# Patient Record
Sex: Female | Born: 1980 | Race: White | Hispanic: No | Marital: Married | Smoking: Never smoker
Health system: Southern US, Community
[De-identification: ages and names within clinical notes are randomized; demographics above are authoritative.]

## PROBLEM LIST (undated history)

## (undated) DIAGNOSIS — G43909 Migraine, unspecified, not intractable, without status migrainosus: Secondary | ICD-10-CM

## (undated) DIAGNOSIS — E079 Disorder of thyroid, unspecified: Secondary | ICD-10-CM

## (undated) DIAGNOSIS — E559 Vitamin D deficiency, unspecified: Secondary | ICD-10-CM

## (undated) DIAGNOSIS — L509 Urticaria, unspecified: Secondary | ICD-10-CM

## (undated) HISTORY — DX: Urticaria, unspecified: L50.9

## (undated) HISTORY — DX: Migraine, unspecified, not intractable, without status migrainosus: G43.909

## (undated) HISTORY — DX: Vitamin D deficiency, unspecified: E55.9

## (undated) HISTORY — DX: Disorder of thyroid, unspecified: E07.9

---

## 2013-11-02 ENCOUNTER — Ambulatory Visit (INDEPENDENT_AMBULATORY_CARE_PROVIDER_SITE_OTHER): Payer: BC Managed Care – PPO | Admitting: Internal Medicine

## 2013-11-02 ENCOUNTER — Encounter: Payer: Self-pay | Admitting: Internal Medicine

## 2013-11-02 VITALS — BP 108/62 | HR 85 | Temp 98.4°F | Resp 12 | Ht 67.5 in | Wt 291.0 lb

## 2013-11-02 DIAGNOSIS — E063 Autoimmune thyroiditis: Secondary | ICD-10-CM | POA: Insufficient documentation

## 2013-11-02 LAB — TSH: TSH: 0.94 u[IU]/mL (ref 0.35–5.50)

## 2013-11-02 LAB — T4, FREE: Free T4: 1.45 ng/dL (ref 0.60–1.60)

## 2013-11-02 MED ORDER — LEVOTHYROXINE SODIUM 112 MCG PO TABS
224.0000 ug | ORAL_TABLET | Freq: Every day | ORAL | Status: DC
Start: 2013-11-02 — End: 2014-02-12

## 2013-11-02 NOTE — Progress Notes (Addendum)
Patient ID: Courtney Miranda, female   DOB: 08/13/1980, 33 y.o.   MRN: [id number]   HPI  Courtney Miranda is a 33 y.o.-year-old female, self-referred for evaluation for hypothyroidism. Her mother accompanies her today and offers part of the history.  Pt. has been dx with hypothyroidism in 2000; is on Synthroid 224 mcg, taken: - fasting - with water  - separated by 2h from b'fast (at 4 am and she starts work at 5 am) - no calcium, iron, PPIs, multivitamins   I reviewed pt's thyroid tests: -04/2013: TSH high >> 250 mcg decreased to 224 mcg Synthroid DAW.  She feels the best at the [facility].   Pt denies feeling nodules in neck, hoarseness, dysphagia/odynophagia, SOB with lying down.   Pt describes: - + gets sick easy - + cold intolerance - recently - + weight loss: 56 lbs in 1.5-2 years >> but in last 2 mo: gained 15 back, not changing anything - + fatigue - + sleeps great at night  - + constipation - + dry skin - + hair thinning and falling - + nails thin, brittle - + anxiety/+ depression >> started in 03/2013 before Synthroid dose change >> got worse - + migraines - + regular menstrual cycles - + occasional rashes >> resolved in 7 days - + memory loss  She has + FH of thyroid disorders in: brother with hypothyroidism; also great great GM and one aunt. No FH of thyroid cancer.  No h/o radiation tx to head or neck.No recent use of iodine supplements.  She had a Kenalog inj for sinus infection 2 weeks ago.   I reviewed her chart and she also has a history of PCOS, but last endocrinologist told her that she testosterone level was normal >> unlikely PCOS. She has vitamin D deficiency 21 >> 27.   Walks 2-3x a week for exercise.   Meals: - Breakfast: chicken bisquit - Lunch: meat + 2-3 veggies - Dinner: meat + 2-3 veggies - Snacks: fruit or veggie 0-1/day  ROS: Constitutional: see HPI Eyes: no blurry vision, no xerophthalmia ENT: + sore throat, + nodules palpated in throat, no  dysphagia/odynophagia, + occasional hoarseness Cardiovascular: no CP/SOB/palpitations/leg swelling Respiratory: no cough/SOB Gastrointestinal: no N/V/D/+ C Musculoskeletal: + both muscle/joint aches Skin: + rash, easy bruising, itching, hair loss Neurological: no tremors/numbness/tingling/dizziness Psychiatric:+ both  depression/anxiety  Past Medical History  Diagnosis Date  . Thyroid disease    Past Surgical History  Procedure Laterality Date  . Cesarean section  2002   History   Social History  . Marital Status: Single    Spouse Name: N/A    Number of Children: 1, age 913   Occupational History  . Crew member [employer]   Social History Main Topics  . Smoking status: Never Smoker   . Smokeless tobacco: Not on file  . Alcohol Use: No  . Drug Use: No   Current Outpatient Rx  Name  Route  Sig  Dispense  Refill  . Biotin 5000 MCG CAPS   Oral   Take 1 capsule by mouth daily.         . cholecalciferol (VITAMIN D) 1000 UNITS tablet   Oral   Take 4,000 Units by mouth daily.         Marland Kitchen. levothyroxine (SYNTHROID) 112 MCG tablet   Oral   Take 2 tablets (224 mcg total) by mouth daily before breakfast.   60 tablet   2     Allergies  Allergen Reactions  .  Imitrex [Sumatriptan]   . Reglan [Metoclopramide]   . Relpax [Eletriptan]    Family History  Problem Relation Age of Onset  . Fibromyalgia Mother   . Hypertension Mother   . Hypertension Father   . Thyroid disease Brother   . Hyperlipidemia Maternal Grandmother   . Diabetes Paternal Grandfather   . Stroke Paternal Grandfather   . Hypertension Paternal Grandfather    PE: BP 108/62  Pulse 85  Temp(Src) 98.4 F (36.9 C) (Oral)  Resp 12  Ht 5' 7.5" (1.715 m)  Wt 291 lb (131.997 kg)  BMI 44.88 kg/m2  SpO2 97%  LMP 10/21/2013 Wt Readings from Last 3 Encounters:  11/02/13 291 lb (131.997 kg)   Constitutional: obese, in NAD Eyes: PERRLA, EOMI, no exophthalmos ENT: moist mucous membranes, no  thyromegaly, no cervical lymphadenopathy Cardiovascular: RRR, No MRG Respiratory: CTA B Gastrointestinal: abdomen soft, NT, ND, BS+ Musculoskeletal: no deformities, strength intact in all 4 Skin: moist, warm, + rashes - cat scratches on arms Neurological: no tremor with outstretched hands, DTR normal in all 4  ASSESSMENT: 1. Hashimoto's hypothyroidism  PLAN:  1. Patient with long-standing hypothyroidism, on levothyroxine therapy. She appears euthyroid, but has multiple complaints (see HPI) - We discussed about correct intake of levothyroxine, fasting, with water, separated by at least 30 minutes from breakfast, and separated by more than 4 hours from calcium, iron, multivitamins, acid reflux medications (PPIs). - She does not appear to have a goiter, thyroid nodules, or neck compression symptoms - we'll check thyroid tests today: TSH, free T4 - If these are abnormal, she will need to return in 6-8 weeks for repeat labs - If these are normal, I will see her back in 4 months - we discussed that she may need eval by an allergist since some of the sxs can be explained by food or other alergies - will obtain records from PCP with latest labs (thyroid, etc.)   Office Visit on 11/02/2013  Component Date Value Ref Range Status  . TSH 11/02/2013 0.94  0.35 - 5.50 uIU/mL Final  . Free T4 11/02/2013 1.45  0.60 - 1.60 ng/dL Final   TFTs normal. We can decrease the dose a little if she prefers as she likes being at the [facility].  Received labs from [facility]:  05/08/2013: - Total testosterone 18 (8-48), free testosterone <0.2 (0-2.2) - vitamin D 28.4 - Hemoglobin A1c 5.4% - TSH 0.12 (0.45-4.5), free T3 3.19 (2-4.4), free T4 1.88 (0.82-1.77) >> Synthroid dose was decreased to 2x 112 mcg daily. Plan for repeat labs in 6 weeks.  01/15/2013:  - Vitamin D 26.9  10/31/2012: - BMP normal, except glucose 101 - TSH 0.99, free T4-1 0.73 >> Synthroid dose maintained at 125 mcg x2  daily - total Testosterone 21, free testosterone 0.6

## 2013-11-02 NOTE — Patient Instructions (Signed)
Please stop at the lab. Please join MyChart >> I will send you the results through there. Please return in 4 months.

## 2013-11-05 ENCOUNTER — Encounter: Payer: Self-pay | Admitting: *Deleted

## 2014-02-12 ENCOUNTER — Other Ambulatory Visit: Payer: Self-pay | Admitting: *Deleted

## 2014-02-12 MED ORDER — LEVOTHYROXINE SODIUM 112 MCG PO TABS: 224.0000 ug | ORAL_TABLET | Freq: Every day | ORAL | Status: DC

## 2014-02-19 ENCOUNTER — Encounter: Payer: Self-pay | Admitting: Internal Medicine

## 2014-02-19 ENCOUNTER — Ambulatory Visit: Payer: BC Managed Care – PPO | Admitting: Internal Medicine

## 2014-02-19 ENCOUNTER — Ambulatory Visit (INDEPENDENT_AMBULATORY_CARE_PROVIDER_SITE_OTHER): Payer: BC Managed Care – PPO | Admitting: Internal Medicine

## 2014-02-19 VITALS — BP 110/76 | HR 84 | Temp 97.7°F | Resp 12 | Wt 297.0 lb

## 2014-02-19 DIAGNOSIS — E063 Autoimmune thyroiditis: Secondary | ICD-10-CM

## 2014-02-19 DIAGNOSIS — R110 Nausea: Secondary | ICD-10-CM

## 2014-02-19 NOTE — Progress Notes (Addendum)
Patient ID: Courtney Miranda, female   DOB: 05/06/1981, 33 y.o.   MRN: [id number]   HPI  Courtney Miranda is a 33 y.o.-year-old female, returning for f/u for Hashimoto's hypothyroidism, dx 2000. Last visit 3.5 mo ago.  She is on Synthroid DAW 224 mcg, taken: - fasting - with water  - separated by 2h from b'fast (at 4 am and she starts work at 5 am) - no calcium, iron, PPIs, multivitamins   I reviewed pt's thyroid tests: Lab Results  Component Value Date   TSH 0.94 11/02/2013   FREET4 1.45 11/02/2013  04/2013: TSH high >> 250 mcg decreased to 224 mcg Synthroid DAW.   She feels the best at the [facility].   Pt denies feeling nodules in neck, hoarseness, dysphagia/odynophagia, SOB with lying down.   Pt describes: - + more nausea in am lately - vomited once - + weight gain - no cold intolerance - no fatigue - + sleeps well at night  - no constipation - no dry skin - no hair thinning and falling - + anxiety/+ depression  - + migraines - + irregular menses lately   She also has a history of PCOS, but last endocrinologist told her that she testosterone level was normal >> unlikely PCOS. She has vitamin D deficiency 21 >> 27.   ROS: Constitutional: see HPI Eyes: no blurry vision, no xerophthalmia ENT: + sore throat, + nodules palpated in throat, no dysphagia/odynophagia, + occasional hoarseness Cardiovascular: no CP/SOB/palpitations/leg swelling Respiratory: no cough/SOB Gastrointestinal: no N/+ V/no D/C/heartburn Musculoskeletal: + both muscle/joint aches Skin: no rash, + easy bruising Neurological: no tremors/numbness/tingling/dizziness, more HAs recently  PE: BP 110/76  Pulse 84  Temp(Src) 97.7 F (36.5 C) (Oral)  Resp 12  Wt 297 lb (134.718 kg)  SpO2 95% Wt Readings from Last 3 Encounters:  02/19/14 297 lb (134.718 kg)  11/02/13 291 lb (131.997 kg)   Constitutional: obese, in NAD Eyes: PERRLA, EOMI, no exophthalmos ENT: moist mucous membranes, no thyromegaly, no cervical  lymphadenopathy Cardiovascular: RRR, No MRG Respiratory: CTA B Gastrointestinal: abdomen soft, NT, ND, BS+ Musculoskeletal: no deformities, strength intact in all 4 Skin: moist, warm, + rashes - cat scratches on arms Neurological: no tremor with outstretched hands, DTR normal in all 4  ASSESSMENT: 1. Hashimoto's hypothyroidism Received labs from [facility]:  05/08/2013: - Total testosterone 18 (8-48), free testosterone <0.2 (0-2.2) - vitamin D 28.4 - Hemoglobin A1c 5.4% - TSH 0.12 (0.45-4.5), free T3 3.19 (2-4.4), free T4 1.88 (0.82-1.77) >> Synthroid dose was decreased to 2x 112 mcg daily. Plan for repeat labs in 6 weeks.  01/15/2013:  - Vitamin D 26.9  10/31/2012: - BMP normal, except glucose 101 - TSH 0.99, free T4-1 0.73 >> Synthroid dose maintained at 125 mcg x2 daily - total Testosterone 21, free testosterone 0.6  2. Nausea  PLAN:  1. Patient with long-standing hypothyroidism, on levothyroxine therapy. She appears euthyroid, but continues to gain weight. She also has more nausea lately and also HAs. - We again discussed about correct intake of levothyroxine, fasting, with water, separated by at least 30 minutes from breakfast, and separated by more than 4 hours from calcium, iron, multivitamins, acid reflux medications (PPIs). She is taking it correctly. - She does not appear to have a goiter, thyroid nodules, or neck compression symptoms - we'll check thyroid tests today: TSH, free T4 - If these are abnormal, she will need to return in 6-8 weeks for repeat labs - If these are normal, I will see  her back in 12 months - She needs a refill of Synthroid DAW.  2. Nausea in am  - + irreg menses (although she does have PCOS) - will add a Pregnancy test  Office Visit on 02/19/2014  Component Date Value Ref Range Status  . TSH 02/19/2014 0.46  0.35 - 4.50 uIU/mL Final   The rest of the labs lost en route to the lab (?) >> will return to re-draw. Component      Latest Ref Rng 02/27/2014  Free T4     0.60 - 1.60 ng/dL 6.96  Quantitative HCG      0.23  Not pregnant.  Will continue the same dose of Synthroid 224 mcg daily,

## 2014-02-19 NOTE — Patient Instructions (Addendum)
Please stop at the lab. Please come back for a follow-up appointment in 12 months.

## 2014-02-20 LAB — TSH: TSH: 0.46 u[IU]/mL (ref 0.35–4.50)

## 2014-02-27 ENCOUNTER — Telehealth: Payer: Self-pay | Admitting: Internal Medicine

## 2014-02-27 ENCOUNTER — Other Ambulatory Visit (INDEPENDENT_AMBULATORY_CARE_PROVIDER_SITE_OTHER): Payer: BC Managed Care – PPO

## 2014-02-27 ENCOUNTER — Other Ambulatory Visit: Payer: Self-pay | Admitting: *Deleted

## 2014-02-27 DIAGNOSIS — R110 Nausea: Secondary | ICD-10-CM

## 2014-02-27 DIAGNOSIS — E063 Autoimmune thyroiditis: Secondary | ICD-10-CM

## 2014-02-27 NOTE — Telephone Encounter (Signed)
Called pt and advised her that the lab did not do all the test Dr Elvera LennoxGherghe ordered for pt. Pt to come back today to have labs repeated. Be advised.

## 2014-02-27 NOTE — Telephone Encounter (Signed)
Pt returning your call

## 2014-02-28 ENCOUNTER — Telehealth: Payer: Self-pay | Admitting: *Deleted

## 2014-02-28 LAB — T4, FREE: Free T4: 1.52 ng/dL (ref 0.60–1.60)

## 2014-02-28 LAB — HCG, QUANTITATIVE, PREGNANCY: QUANTITATIVE HCG: 0.23 m[IU]/mL

## 2014-02-28 MED ORDER — LEVOTHYROXINE SODIUM 112 MCG PO TABS: 224.0000 ug | ORAL_TABLET | Freq: Every day | ORAL | Status: DC

## 2014-02-28 NOTE — Addendum Note (Signed)
Addended by: Carlus PavlovGHERGHE, Karol Skarzynski on: 02/28/2014 07:50 AM   Modules accepted: Orders

## 2014-02-28 NOTE — Telephone Encounter (Signed)
Called and lvm advising her that her HCG test was negative. Thyroid test are normal. Stay on the same dosage of Synthroid, 224 mcg. Refill sent to her pharmacy.

## 2014-06-10 ENCOUNTER — Encounter: Payer: Self-pay | Admitting: Neurology

## 2014-06-10 ENCOUNTER — Ambulatory Visit (INDEPENDENT_AMBULATORY_CARE_PROVIDER_SITE_OTHER): Payer: BC Managed Care – PPO | Admitting: Neurology

## 2014-06-10 VITALS — BP 132/94 | HR 104 | Resp 16 | Ht 68.0 in | Wt 292.0 lb

## 2014-06-10 DIAGNOSIS — R519 Headache, unspecified: Secondary | ICD-10-CM

## 2014-06-10 DIAGNOSIS — R51 Headache: Secondary | ICD-10-CM

## 2014-06-10 MED ORDER — FROVATRIPTAN SUCCINATE 2.5 MG PO TABS: ORAL_TABLET | ORAL | Status: DC

## 2014-06-10 MED ORDER — NADOLOL 20 MG PO TABS
20.0000 mg | ORAL_TABLET | Freq: Every day | ORAL | Status: DC
Start: 2014-06-10 — End: 2015-05-16

## 2014-06-10 MED ORDER — SERTRALINE HCL 25 MG PO TABS: ORAL_TABLET | ORAL | Status: DC

## 2014-06-10 NOTE — Patient Instructions (Addendum)
1. Schedule MRI brain without contrast 2. Restart Sertraline 25mg : Take 1 tablet daily for 1 week, then increase to 2 tablets daily 3. After a month of starting Sertraline, restart Nadolol 20mg : Take 1 tablet daily 4. Start reducing Topamax by 1 tablet every week  5. Take Frova 2.5mg : Take 1 tablet at onset of headache, may take second dose after 2 hours. Do not take more than 3 a week to avoid rebound headaches 6. Keep headache diary 7. Follow-up in 3 months

## 2014-06-10 NOTE — Progress Notes (Signed)
NEUROLOGY Miranda NOTE  Courtney Miranda MRN: [id number] DOB: Mar 19, 1981  Referring provider: Dr. Blane Ohara Primary care provider: Dr. Blane Ohara  Reason for consult:  Worsening headaches  Dear Dr Sedalia Muta:  Thank you for your kind referral of Courtney Miranda of the above symptoms. Although her history is well known to you, please allow me to reiterate it for the purpose of our medical record. Records and images were personally reviewed where available.  HISTORY OF PRESENT ILLNESS: This is a pleasant 33 year old right-handed woman with a history of hypothyroidism and migraines since age 4, presenting with a change in headaches and dizziness. She was first diagnosed with migraines at age 28, initially occurring often, then became less frequent with a headache-free period of up to 2 years at a time. In July, she started having headaches which is "nothing like I had before, more severe and different." She woke up with nausea and vomiting, dizziness, and her head pounding. She was nauseated the whole weekend with headaches mostly over the right hemisphere and behind the right eye radiating down her neck. She described the pain as sharp stabbing pain on the right temporal region "like an explosion" lasting 5 minutes, leaving her head very sensitive to touch. She has been keeping a headache diary and had right-sided throbbing headaches daily with sharp pains once a week or so, but states last week was not so bad. Pain is usually a 3/10. She took Tylenol only twice last week. Ibuprofen makes the pain instantly worse. With more severe headaches, she has dizziness described as spinning, difficulties focusing, with photo/phonophobia and heightened sensitivity to smells. [facility] and started Topamax, currently on 75mg  qhs with minimal change. She has not been back for follow-up. She was given Baclofen to take prn, but this has not helped. She denies any side  effects on Topamax except for occasional paresthesias in her hands.  She denies any diplopia, tinnitus, dysarthria, dysphagia, focal numbness/tingling/weakness, neck/back pain, bowel/bladder dysfunction. She usually gets 7 hours of refreshing sleep and reports she "can sleep anytime." She denies any recent weight changes, she lost 45lbs 6 months ago.  Headache triggers include potatoes, white rice and pasta. She recalls trying Anaprox and Prozac at age 25, this made her drowsy. A few years ago, she was started on Sertraline which helped some with the headaches, but with addition of Nadolol, headaches significantly improved. She lost her job 2 years ago and stopped medication. She has tried Imitrex, Relpax, Reglan, which caused different side effects. No family history of migraines. She denies any head injuries.  PAST MEDICAL HISTORY: Past Medical History  Diagnosis Date  . Thyroid disease     PAST SURGICAL HISTORY: Past Surgical History  Procedure Laterality Date  . Cesarean section  2002    MEDICATIONS: Current Outpatient Prescriptions on File Prior to Visit  Medication Sig Dispense Refill  . levothyroxine (SYNTHROID) 112 MCG tablet Take 2 tablets (224 mcg total) by mouth daily before breakfast. 180 tablet 3   No current facility-administered medications on file prior to visit.    ALLERGIES: Allergies  Allergen Reactions  . Imitrex [Sumatriptan]   . Reglan [Metoclopramide]   . Relpax [Eletriptan]     FAMILY HISTORY: Family History  Problem Relation Age of Onset  . Fibromyalgia Mother   . Hypertension Mother   . Hypertension Father   . Thyroid disease Brother   . Hyperlipidemia Maternal Grandmother   . Diabetes Paternal  Grandfather   . Stroke Paternal Grandfather   . Hypertension Paternal Grandfather     SOCIAL HISTORY: History   Social History  . Marital Status: Single    Spouse Name: N/A    Number of Children: N/A  . Years of Education: N/A   Occupational  History  . Not on file.   Social History Main Topics  . Smoking status: Never Smoker   . Smokeless tobacco: Not on file  . Alcohol Use: No  . Drug Use: No  . Sexual Activity: Not on file   Other Topics Concern  . Not on file   Social History Narrative    REVIEW OF SYSTEMS: Constitutional: No fevers, chills, or sweats, no generalized fatigue, change in appetite Eyes: No visual changes, double vision, eye pain Ear, nose and throat: No hearing loss, ear pain, nasal congestion, sore throat Cardiovascular: No chest pain, palpitations Respiratory:  No shortness of breath at rest or with exertion, wheezes GastrointestinaI: No nausea, vomiting, diarrhea, abdominal pain, fecal incontinence Genitourinary:  No dysuria, urinary retention or frequency Musculoskeletal:  No neck pain, back pain Integumentary: No rash, pruritus, skin lesions Neurological: as above Psychiatric: No depression, insomnia, anxiety Endocrine: No palpitations, fatigue, diaphoresis, mood swings, change in appetite, change in weight, increased thirst Hematologic/Lymphatic:  No anemia, purpura, petechiae. Allergic/Immunologic: no itchy/runny eyes, nasal congestion, recent allergic reactions, rashes  PHYSICAL EXAM: Filed Vitals:   06/10/14 1403  BP: 132/94  Pulse: 104  Resp: 16   General: No acute distress Head:  Normocephalic/atraumatic Eyes: Fundoscopic exam shows bilateral sharp discs, no vessel changes, exudates, or hemorrhages Neck: supple, no paraspinal tenderness, full range of motion Back: No paraspinal tenderness Heart: regular rate and rhythm Lungs: Clear to auscultation bilaterally. Vascular: No carotid bruits. Skin/Extremities: No rash, no edema. Toes in left foot in bandage Neurological Exam: Mental status: alert and oriented to person, place, and time, no dysarthria or aphasia, Fund of knowledge is appropriate.  Recent and remote memory are intact.  Attention and concentration are normal.    Able  to name objects and repeat phrases. Cranial nerves: CN I: not tested CN II: pupils equal, round and reactive to light, visual fields intact, fundi unremarkable. CN III, IV, VI:  full range of motion, no nystagmus, no ptosis CN V: facial sensation intact CN VII: upper and lower face symmetric CN VIII: hearing intact to finger rub CN IX, X: gag intact, uvula midline CN XI: sternocleidomastoid and trapezius muscles intact CN XII: tongue midline Bulk & Tone: normal, no fasciculations. Motor: 5/5 throughout with no pronator drift. Sensation: intact to light touch, cold, pin, vibration and joint position sense.  No extinction to double simultaneous stimulation.  Romberg test negative Deep Tendon Reflexes: +2 throughout, no ankle clonus Plantar responses: downgoing bilaterally Cerebellar: no incoordination on finger to nose, heel to shin. No dysdiadochokinesia Gait: narrow-based and steady, able to tandem walk adequately. Tremor: none  IMPRESSION: This is a 33 year old right-handed woman with a history of hypothyroidism and migraines presenting with a change in headache quality and frequency for the past 4 months, mostly localized over the right hemisphere. Neurological exam is unremarkable. MRI brain without contrast will be ordered to assess for underlying structural abnormality. She has not had much response from Topamax, and although there is room for increase, we discussed that in the past she had good response to the combination of Sertraline and Nadolol, and will likely benefit from restarting this for headache prophylaxis. She will start sertraline first,  with uptitration schedule, then add-on low dose Nadolol. Side effects were discussed. She will start tapering off the Topamax off over the next 3 weeks. She has tried several triptans for rescue, and will do a trial of Frova to take at the onset of headaches. We discussed minimizing any rescue medication to 2-3 a week to avoid rebound  headaches. She will keep a headache diary and follow-up in 3 months.  Thank you for allowing me to participate in the care of this patient. Please do not hesitate to call for any questions or concerns.   Patrcia DollyKaren Shawanda Sievert, M.D.  CC: Dr. Sedalia Mutaox

## 2014-06-11 ENCOUNTER — Encounter: Payer: Self-pay | Admitting: Neurology

## 2014-06-11 NOTE — Progress Notes (Signed)
Done

## 2014-06-19 ENCOUNTER — Other Ambulatory Visit: Payer: Self-pay | Admitting: Neurology

## 2014-06-19 DIAGNOSIS — G43009 Migraine without aura, not intractable, without status migrainosus: Secondary | ICD-10-CM

## 2014-06-19 MED ORDER — ZOLMITRIPTAN 5 MG PO TABS: ORAL_TABLET | ORAL | Status: DC

## 2014-06-21 ENCOUNTER — Ambulatory Visit (HOSPITAL_COMMUNITY)
Admission: RE | Admit: 2014-06-21 | Discharge: 2014-06-21 | Disposition: A | Discharge status: Home / Self Care | Payer: BC Managed Care – PPO | Source: Ambulatory Visit | Attending: Neurology | Admitting: Neurology

## 2014-06-21 DIAGNOSIS — R51 Headache: Secondary | ICD-10-CM | POA: Diagnosis present

## 2014-06-25 ENCOUNTER — Telehealth: Payer: Self-pay | Admitting: Neurology

## 2014-06-25 NOTE — Telephone Encounter (Signed)
Returned call. Patient was notified of normal result.

## 2014-06-25 NOTE — Telephone Encounter (Signed)
Call after two please and call [phone] would like the MRI results

## 2014-09-09 ENCOUNTER — Ambulatory Visit (INDEPENDENT_AMBULATORY_CARE_PROVIDER_SITE_OTHER): Payer: BLUE CROSS/BLUE SHIELD | Admitting: Neurology

## 2014-09-09 ENCOUNTER — Encounter: Payer: Self-pay | Admitting: Neurology

## 2014-09-09 VITALS — BP 112/80 | HR 80 | Ht 67.5 in | Wt 290.0 lb

## 2014-09-09 DIAGNOSIS — G43009 Migraine without aura, not intractable, without status migrainosus: Secondary | ICD-10-CM

## 2014-09-09 NOTE — Patient Instructions (Signed)
1. Reduce Nadolol 20mg : Take 1/2 tablet daily 2. Continue Sertraline 50mg  daily 3. Keep headache diary 4. Follow-up in 3 months

## 2014-09-09 NOTE — Progress Notes (Signed)
NEUROLOGY FOLLOW UP OFFICE NOTE  Courtney Miranda [id number]  HISTORY OF PRESENT ILLNESS: I had the pleasure of seeing Courtney Miranda in follow-up in the neurology clinic on 09/09/2014.  The patient was last seen 3 months ago for a change in headaches and dizziness. Records and images were personally reviewed where available.  I personally reviewed MRI brain without contrast which was normal. Since her last visit, she has tapered off the Topamax. She had good response to combination of Sertraline and Nadolol several years ago, and restarted this combination with good effect. She brings a calendar of her headaches, she has had only 1 moderate to severe headache since February, 1 moderate, and 7 mild ones. She has noticed that prn Tylenol works better now. She was also reporting dizziness with the headaches, which still occurs but is much less, and sometimes separate from the headaches. She has had some nausea and vomiting, but no further vomiting this month. She describes the dizziness as a floating sensation, and has noticed her BP has been dropping to 97/53, usually she runs in the 100s/60s. She would like to reduce Nadolol dose. Mood is good. She denies any vision changes, no focal numbness/tingling/weakness, no tinnitus, no palpitations.   HPI: This is a pleasant 34 yo RH woman with a history of hypothyroidism and migraines since age 53, who presented with a change in headaches and dizziness. She was first diagnosed with migraines at age 38, initially occurring often, then became less frequent with a headache-free period of up to 2 years at a time. In July 2015, she started having headaches which is "nothing like I had before, more severe and different." She woke up with nausea and vomiting, dizziness, and her head pounding. She was nauseated the whole weekend with headaches mostly over the right hemisphere and behind the right eye radiating down her neck. She described the pain as sharp stabbing pain on the right  temporal region "like an explosion" lasting 5 minutes, leaving her head very sensitive to touch. She has been keeping a headache diary and had right-sided throbbing headaches daily with sharp pains once a week or so. Pain is usually a 3/10. Ibuprofen makes the pain instantly worse. With more severe headaches, she has dizziness described as spinning, difficulties focusing, with photo/phonophobia and heightened sensitivity to smells. [facility] 2015 and started Topamax, on  qhs with minimal change. She has not been back for follow-up. She was given Baclofen to take prn, but this has not helped. She denies any side effects on Topamax except for occasional paresthesias in her hands.  Headache triggers include potatoes, white rice and pasta. She recalls trying Anaprox and Prozac at age 24, this made her drowsy. A few years ago, she was started on Sertraline which helped some with the headaches, but with addition of Nadolol, headaches significantly improved. She lost her job 2 years ago and stopped medication. She has tried Imitrex, Relpax, Reglan, which caused different side effects. No family history of migraines. She denies any head injuries.  PAST MEDICAL HISTORY: Past Medical History  Diagnosis Date  . Thyroid disease     MEDICATIONS: Current Outpatient Prescriptions on File Prior to Visit  Medication Sig Dispense Refill  . levothyroxine (SYNTHROID) 112 MCG tablet Take 2 tablets (224 mcg total) by mouth daily before breakfast. 180 tablet 3  . nadolol (CORGARD) 20 MG tablet Take 1 tablet (20 mg total) by mouth daily. 30 tablet 5  . sertraline (ZOLOFT)  25 MG tablet Take 1 tablet daily for 1 week, then increase to 2 tablets daily 60 tablet 5   No current facility-administered medications on file prior to visit.    ALLERGIES: Allergies  Allergen Reactions  . Imitrex [Sumatriptan]   . Reglan [Metoclopramide]   . Relpax [Eletriptan]     FAMILY  HISTORY: Family History  Problem Relation Age of Onset  . Fibromyalgia Mother   . Hypertension Mother   . Hypertension Father   . Thyroid disease Brother   . Hyperlipidemia Maternal Grandmother   . Diabetes Paternal Grandfather   . Stroke Paternal Grandfather   . Hypertension Paternal Grandfather     SOCIAL HISTORY: History   Social History  . Marital Status: Married    Spouse Name: N/A  . Number of Children: N/A  . Years of Education: N/A   Occupational History  . Not on file.   Social History Main Topics  . Smoking status: Never Smoker   . Smokeless tobacco: Not on file  . Alcohol Use: No  . Drug Use: No  . Sexual Activity: Not on file   Other Topics Concern  . Not on file   Social History Narrative    REVIEW OF SYSTEMS: Constitutional: No fevers, chills, or sweats, no generalized fatigue, change in appetite Eyes: No visual changes, double vision, eye pain Ear, nose and throat: No hearing loss, ear pain, nasal congestion, sore throat Cardiovascular: No chest pain, palpitations Respiratory:  No shortness of breath at rest or with exertion, wheezes GastrointestinaI: No nausea, vomiting, diarrhea, abdominal pain, fecal incontinence Genitourinary:  No dysuria, urinary retention or frequency Musculoskeletal:  No neck pain, back pain Integumentary: No rash, pruritus, skin lesions Neurological: as above Psychiatric: No depression, insomnia, anxiety Endocrine: No palpitations, fatigue, diaphoresis, mood swings, change in appetite, change in weight, increased thirst Hematologic/Lymphatic:  No anemia, purpura, petechiae. Allergic/Immunologic: no itchy/runny eyes, nasal congestion, recent allergic reactions, rashes  PHYSICAL EXAM: Filed Vitals:   09/09/14 1519  BP: 112/80  Pulse: 80   General: No acute distress Head:  Normocephalic/atraumatic Neck: supple, no paraspinal tenderness, full range of motion Heart:  Regular rate and rhythm Lungs:  Clear to  auscultation bilaterally Back: No paraspinal tenderness Skin/Extremities: No rash, no edema Neurological Exam: alert and oriented to person, place, and time. No aphasia or dysarthria. Fund of knowledge is appropriate.  Recent and remote memory are intact.  Attention and concentration are normal.    Able to name objects and repeat phrases. Cranial nerves: Pupils equal, round, reactive to light.  Fundoscopic exam unremarkable, no papilledema. Extraocular movements intact with no nystagmus. Visual fields full. Facial sensation intact. No facial asymmetry. Tongue, uvula, palate midline.  Motor: Bulk and tone normal, muscle strength 5/5 throughout with no pronator drift.  Sensation to light touch intact.  No extinction to double simultaneous stimulation.  Deep tendon reflexes 2+ throughout, toes downgoing.  Finger to nose testing intact.  Gait narrow-based and steady, able to tandem walk adequately.  Romberg negative.  IMPRESSION: This is a 34 yo RH woman with a history of hypothyroidism and migraines who presented with a change in headache quality and frequency, mostly localized over the right hemisphere. Neurological exam and MRI brain normal. She has noticed an improvement in the headaches with restarting combination Sertraline and Nadolol, however has been more dizzy with low BP, and will reduce Nadolol dose to 1/2 tablet daily. She feels prn Tylenol now helps better with the headaches, and knows to minimize Tylenol to  2-3 times a week to avoid rebound headaches. She will continue to keep a headache diary and follow-up in 3 months.  Thank you for allowing me to participate in her care.  Please do not hesitate to call for any questions or concerns.  The duration of this appointment visit was 25 minutes of face-to-face time with the patient.  Greater than 50% of this time was spent in counseling, explanation of diagnosis, planning of further management, and coordination of care.   Courtney Miranda,  M.D.   CC: Dr. Sedalia Mutaox

## 2014-09-11 ENCOUNTER — Encounter: Payer: Self-pay | Admitting: Neurology

## 2014-09-11 DIAGNOSIS — G43009 Migraine without aura, not intractable, without status migrainosus: Secondary | ICD-10-CM | POA: Insufficient documentation

## 2014-09-11 NOTE — Progress Notes (Signed)
Done

## 2014-11-14 ENCOUNTER — Encounter: Payer: Self-pay | Admitting: Neurology

## 2014-12-25 ENCOUNTER — Ambulatory Visit: Payer: BLUE CROSS/BLUE SHIELD | Admitting: Neurology

## 2015-01-03 ENCOUNTER — Ambulatory Visit: Payer: BLUE CROSS/BLUE SHIELD | Admitting: Neurology

## 2015-02-03 ENCOUNTER — Other Ambulatory Visit: Payer: Self-pay | Admitting: Neurology

## 2015-02-05 ENCOUNTER — Ambulatory Visit (INDEPENDENT_AMBULATORY_CARE_PROVIDER_SITE_OTHER): Payer: BLUE CROSS/BLUE SHIELD | Admitting: Neurology

## 2015-02-05 ENCOUNTER — Encounter: Payer: Self-pay | Admitting: Neurology

## 2015-02-05 VITALS — BP 122/86 | HR 57 | Resp 16 | Ht 67.5 in | Wt 298.0 lb

## 2015-02-05 DIAGNOSIS — G43009 Migraine without aura, not intractable, without status migrainosus: Secondary | ICD-10-CM | POA: Diagnosis not present

## 2015-02-05 MED ORDER — RIZATRIPTAN BENZOATE 5 MG PO TBDP: 5.0000 mg | ORAL_TABLET | ORAL | Status: DC | PRN

## 2015-02-05 MED ORDER — TIZANIDINE HCL 4 MG PO TABS: 4.0000 mg | ORAL_TABLET | Freq: Three times a day (TID) | ORAL | Status: DC | PRN

## 2015-02-05 MED ORDER — SERTRALINE HCL 50 MG PO TABS
ORAL_TABLET | ORAL | Status: DC
Start: 2015-02-05 — End: 2015-05-16

## 2015-02-05 NOTE — Patient Instructions (Signed)
1. Increase Zoloft : Take 2 tablets daily 2. Take Tizanidine  as needed for neck pain, monitor for drowsiness, may take up to every 8 hours 3. Take Maxalt  as needed at onset of migraine, may take second dose after 2 hours. Do not take any rescue medication more than 3 times a week to avoid rebound headaches 4. Continue headache diary 5. Follow-up in 3 months

## 2015-02-05 NOTE — Progress Notes (Signed)
NEUROLOGY FOLLOW UP OFFICE NOTE  Courtney Miranda [id number]  HISTORY OF PRESENT ILLNESS: I had the pleasure of seeing Courtney Miranda in follow-up in the neurology clinic on 02/05/2015.  The patient was last seen 5 months ago for increased frequency of migraines. She had an initial good response to restarting Nadolol and Zoloft with reduction in headache frequency. She however noticed low BP and dizziness and requested reduction in Nadolol. She is now on 10mg  daily and denies any further dizziness except with severe migraines, however since March she has again had an increase in headaches. She brings her calendar, with mild to moderate headaches occurring daily, usually after 2pm. 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 She denies any headache triggers except for weather changes. She takes Tylenol once every 3 times she has a headache, but had to take it three times in one day last 7/5 for 10/10 migraine. She has had 4 10/10 migraines in the past week with associated nausea, right-sided neck pain, and dizziness. She denies any vision changes, no focal numbness/tingling/weakness, no tinnitus, no palpitations.   HPI: This is a pleasant 34 yo RH woman with a history of hypothyroidism and migraines since age 34, who presented with a change in headaches and dizziness. She was first diagnosed with migraines at age 34, initially occurring often, then became less frequent with a headache-free period of up to 2 years at a time. In July 2015, she started having headaches which is "nothing like I had before, more severe and different." She woke up with nausea and vomiting, dizziness, and her head pounding. She was nauseated the whole weekend with headaches mostly over the right hemisphere and behind the right eye radiating down her neck. She described the pain as sharp stabbing pain on the right temporal region "like an explosion" lasting 5 minutes, leaving her head very  sensitive to touch. She has been keeping a headache diary and had right-sided throbbing headaches daily with sharp pains once a week or so. Pain is usually a 3/10. Ibuprofen makes the pain instantly worse. With more severe headaches, she has dizziness described as spinning, difficulties focusing, with photo/phonophobia and heightened sensitivity to smells. She [facility] in September 2015 and started Topamax, on 75mg  qhs with minimal change. She has not been back for follow-up. She was given Baclofen to take prn, but this has not helped. She denies any side effects on Topamax except for occasional paresthesias in her hands.  Headache triggers include potatoes, white rice and pasta. She recalls trying Anaprox and Prozac at age 34, this made her drowsy. A few years ago, she was started on Sertraline which helped some with the headaches, but with addition of Nadolol, headaches significantly improved. She lost her job 2 years ago and stopped medication. She has tried Imitrex, Relpax, Reglan, which caused different side effects. No family history of migraines. She denies any head injuries.  Diagnostic Data: I personally reviewed MRI brain without contrast which was normal.   PAST MEDICAL HISTORY: Past Medical History  Diagnosis Date  . Thyroid disease     MEDICATIONS: Current Outpatient Prescriptions on File Prior to Visit  Medication Sig Dispense Refill  . levothyroxine (SYNTHROID) 112 MCG tablet Take 2 tablets (224 mcg total) by mouth daily before breakfast. 180 tablet 3  . nadolol (CORGARD) 20 MG tablet Take 1 tablet (20 mg total) by mouth daily. (Patient taking differently: 20 mg. Take 1/2  tablet daily) 30 tablet 5  . sertraline (ZOLOFT) 50 MG tablet Take 1 tablet daily 30 tablet 3   No current facility-administered medications on file prior to visit.    ALLERGIES: Allergies  Allergen Reactions  . Imitrex [Sumatriptan]   . Reglan [Metoclopramide]   . Relpax [Eletriptan]      FAMILY HISTORY: Family History  Problem Relation Age of Onset  . Fibromyalgia Mother   . Hypertension Mother   . Hypertension Father   . Thyroid disease Brother   . Hyperlipidemia Maternal Grandmother   . Diabetes Paternal Grandfather   . Stroke Paternal Grandfather   . Hypertension Paternal Grandfather     SOCIAL HISTORY: History   Social History  . Marital Status: Married    Spouse Name: N/A  . Number of Children: N/A  . Years of Education: N/A   Occupational History  . Not on file.   Social History Main Topics  . Smoking status: Never Smoker   . Smokeless tobacco: Not on file  . Alcohol Use: No  . Drug Use: No  . Sexual Activity: Not on file   Other Topics Concern  . Not on file   Social History Narrative    REVIEW OF SYSTEMS: Constitutional: No fevers, chills, or sweats, no generalized fatigue, change in appetite Eyes: No visual changes, double vision, eye pain Ear, nose and throat: No hearing loss, ear pain, nasal congestion, sore throat Cardiovascular: No chest pain, palpitations Respiratory:  No shortness of breath at rest or with exertion, wheezes GastrointestinaI: No nausea, vomiting, diarrhea, abdominal pain, fecal incontinence Genitourinary:  No dysuria, urinary retention or frequency Musculoskeletal:  + neck pain, back pain Integumentary: No rash, pruritus, skin lesions Neurological: as above Psychiatric: No depression, insomnia, anxiety Endocrine: No palpitations, fatigue, diaphoresis, mood swings, change in appetite, change in weight, increased thirst Hematologic/Lymphatic:  No anemia, purpura, petechiae. Allergic/Immunologic: no itchy/runny eyes, nasal congestion, recent allergic reactions, rashes  PHYSICAL EXAM: Filed Vitals:   02/05/15 1028  BP: 122/86  Pulse: 57  Resp: 16   General: No acute distress Head:  Normocephalic/atraumatic Neck: supple, no paraspinal tenderness, full range of motion Heart:  Regular rate and  rhythm Lungs:  Clear to auscultation bilaterally Back: No paraspinal tenderness Skin/Extremities: No rash, no edema Neurological Exam: alert and oriented to person, place, and time. No aphasia or dysarthria. Fund of knowledge is appropriate.  Recent and remote memory are intact.  Attention and concentration are normal.    Able to name objects and repeat phrases. Cranial nerves: Pupils equal, round, reactive to light.  Fundoscopic exam unremarkable, no papilledema. Extraocular movements intact with no nystagmus. Visual fields full. Facial sensation intact. No facial asymmetry. Tongue, uvula, palate midline.  Motor: Bulk and tone normal, muscle strength 5/5 throughout with no pronator drift.  Sensation to light touch intact.  No extinction to double simultaneous stimulation.  Deep tendon reflexes 2+ throughout, toes downgoing.  Finger to nose testing intact.  Gait narrow-based and steady, able to tandem walk adequately.  Romberg negative.  IMPRESSION: This is a 34 yo RH woman with a history of hypothyroidism and migraines who presented with a change in headache quality and frequency, mostly localized over the right hemisphere. Neurological exam and MRI brain normal. She had an initial good response to restarting combination Sertraline and Nadolol, however headaches have recurred, now back to occurring daily. She is hesitant to increase back the Nadolol due to low BP, and will increase Sertraline to  daily. She will take prn  Flexeril for neck pain, and will try Maxalt for migraine rescue. She has tried several triptans in the past with no effect. She knows to minimize Tylenol or any headache rescue medication to 2-3 times a week to avoid rebound headaches. She will continue to keep a headache diary and follow-up in 3 months.  Thank you for allowing me to participate in her care.  Please do not hesitate to call for any questions or concerns.  The duration of this appointment visit was 26 minutes of  face-to-face time with the patient.  Greater than 50% of this time was spent in counseling, explanation of diagnosis, planning of further management, and coordination of care.   Patrcia Dolly, M.D.

## 2015-03-10 ENCOUNTER — Other Ambulatory Visit: Payer: Self-pay | Admitting: Internal Medicine

## 2015-04-30 ENCOUNTER — Ambulatory Visit (INDEPENDENT_AMBULATORY_CARE_PROVIDER_SITE_OTHER): Payer: BLUE CROSS/BLUE SHIELD | Admitting: Internal Medicine

## 2015-04-30 ENCOUNTER — Encounter: Payer: Self-pay | Admitting: Internal Medicine

## 2015-04-30 VITALS — BP 112/68 | HR 73 | Temp 98.2°F | Resp 12 | Wt 301.4 lb

## 2015-04-30 DIAGNOSIS — E063 Autoimmune thyroiditis: Secondary | ICD-10-CM

## 2015-04-30 DIAGNOSIS — E559 Vitamin D deficiency, unspecified: Secondary | ICD-10-CM | POA: Diagnosis not present

## 2015-04-30 LAB — VITAMIN D 25 HYDROXY (VIT D DEFICIENCY, FRACTURES): VITD: 18.32 ng/mL — AB (ref 30.00–100.00)

## 2015-04-30 LAB — T4, FREE: Free T4: 1.3 ng/dL (ref 0.60–1.60)

## 2015-04-30 LAB — TSH: TSH: 1.7 u[IU]/mL (ref 0.35–4.50)

## 2015-04-30 NOTE — Patient Instructions (Signed)
Please continue Synthroid 224 mcg daily.  Take the thyroid hormone every day, with water, >30 minutes before breakfast, separated by >4 hours from acid reflux medications, calcium, iron, multivitamins.  Please stop at the lab.  Please return in 1 year.

## 2015-04-30 NOTE — Progress Notes (Signed)
Patient ID: Courtney Miranda, female   DOB: June 13, 1981, 34 y.o.   MRN: [id number]   HPI  Courtney Miranda is a 34 y.o.-year-old female, returning for f/u for Hashimoto's hypothyroidism, dx 2000. Last visit 1 year and 2 mo ago.  She had sinus inf and laryngitis this summer >> now better. She also had a rash on her legs. She had another sinusitis + bronchitis in September >> 3 ABx.   HAs still present.   She is on Synthroid DAW 224 mcg, taken: - fasting - with water  - separated by at 2h from b'fast (at 3 am and she starts work at 4 am) - no calcium, iron, PPIs - multivitamins at night  I reviewed pt's thyroid tests: Lab Results  Component Value Date   TSH 0.46 02/19/2014   TSH 0.94 11/02/2013   FREET4 1.52 02/27/2014   FREET4 1.45 11/02/2013  04/2013: TSH high >> 250 mcg decreased to 224 mcg Synthroid DAW. TFTs normal after the decrease in dose.  She feels the best at the [facility].   Pt denies feeling nodules in neck, hoarseness, dysphagia/odynophagia, SOB with lying down.   Pt describes: - + weight gain - + nausea better - + cold intolerance - + fatigue - no constipation - no dry skin - no hair thinning and falling - + migraines  She also has a history of PCOS. She has vitamin D deficiency.   Her Nadolol dose was decreased from 20 to 10 mg daily.   ROS: Constitutional: see HPI Eyes: no blurry vision, no xerophthalmia ENT: + sore throat, no nodules palpated in throat, no dysphagia/odynophagia, no hoarseness Cardiovascular: no CP/SOB/palpitations/leg swelling Respiratory: + cough/no SOB Gastrointestinal: + N/no V/D/C/heartburn Musculoskeletal: + both: muscle/joint aches Skin: + rash, + easy bruising Neurological: no tremors/numbness/tingling/dizziness, + HAs  PE: BP 112/68 mmHg  Pulse 73  Temp(Src) 98.2 F (36.8 C) (Oral)  Resp 12  Wt 301 lb 6.4 oz (136.714 kg)  SpO2 96% Body mass index is 46.48 kg/(m^2). Wt Readings from Last 3 Encounters:  04/30/15 301 lb 6.4 oz (136.714 kg)   02/05/15 298 lb (135.172 kg)  09/09/14 290 lb (131.543 kg)   Constitutional: obese, in NAD Eyes: PERRLA, EOMI, no exophthalmos ENT: moist mucous membranes, no thyromegaly, no cervical lymphadenopathy Cardiovascular: RRR, No MRG Respiratory: CTA B Gastrointestinal: abdomen soft, NT, ND, BS+ Musculoskeletal: no deformities, strength intact in all 4 Skin: moist, warm Neurological: no tremor with outstretched hands, DTR normal in all 4  ASSESSMENT: 1. Hashimoto's hypothyroidism  2. Vitamin D def  Labs from [facility]: 05/08/2013: - Total testosterone 18 (8-48), free testosterone <0.2 (0-2.2) - vitamin D 28.4 - Hemoglobin A1c 5.4% - TSH 0.12 (0.45-4.5), free T3 3.19 (2-4.4), free T4 1.88 (0.82-1.77) >> Synthroid dose was decreased to 2x 112 mcg daily. Plan for repeat labs in 6 weeks.  01/15/2013:  - Vitamin D 26.9  10/31/2012: - BMP normal, except glucose 101 - TSH 0.99, free T4-1 0.73 >> Synthroid dose maintained at 125 mcg x2 daily - total Testosterone 21, free testosterone 0.6  PLAN:  1. Patient with long-standing hypothyroidism, on Synthroid DAW 224 mcg.. She appears euthyroid, but has multiple complaints. - We again discussed about correct intake of levothyroxine, fasting, with water, separated by at least 30 minutes from breakfast, and separated by more than 4 hours from calcium, iron, multivitamins, acid reflux medications (PPIs). She is taking it correctly. - She does not appear to have a goiter, thyroid nodules, or neck compression symptoms -  we'll check thyroid tests today: TSH, free T4 - If these are abnormal, she will need to return in 6-8 weeks for repeat labs - If these are normal, I will see her back in 1 year  2. Vit D def - reviewed previous vit D levels - will recheck today - she is only getting vit D from her [facility], we may need to supplement this  Component     Latest Ref Rng 04/30/2015  TSH     0.35 - 4.50 uIU/mL 1.70  Free T4     0.60  - 1.60 ng/dL 4.091.30  VITD     81.1930.00 - 100.00 ng/mL 18.32 (L)   Thyroid tests are great. Vitamin D is low. I would suggest that she starts 5000 units of vitamin D daily and come back for a repeat vitamin D in 2 months. Alternatively, she can go to her PCPs office for recheck.

## 2015-05-12 ENCOUNTER — Telehealth: Payer: Self-pay | Admitting: Neurology

## 2015-05-12 NOTE — Telephone Encounter (Signed)
Returned call to P[place]kaa[place]ie. She wanted clarification on patient's Zoloft Rx. Patient is supposed to be taking two 50mg  tablets daily.

## 2015-05-12 NOTE — Telephone Encounter (Signed)
[facility]'s [facility] for [facility]/Dawn CB# [phone]

## 2015-05-16 ENCOUNTER — Encounter: Payer: Self-pay | Admitting: Neurology

## 2015-05-16 ENCOUNTER — Ambulatory Visit (INDEPENDENT_AMBULATORY_CARE_PROVIDER_SITE_OTHER): Payer: BLUE CROSS/BLUE SHIELD | Admitting: Neurology

## 2015-05-16 VITALS — BP 120/82 | HR 75 | Ht 67.5 in | Wt 303.0 lb

## 2015-05-16 DIAGNOSIS — G43009 Migraine without aura, not intractable, without status migrainosus: Secondary | ICD-10-CM | POA: Diagnosis not present

## 2015-05-16 MED ORDER — ZONISAMIDE 100 MG PO CAPS: ORAL_CAPSULE | ORAL | Status: DC

## 2015-05-16 NOTE — Patient Instructions (Signed)
1. Start Zonegran 100mg : take 1 capsule at night for 2 weeks, then increase to 2 capsules at night 2. After 2 weeks of taking Zonegran, stop Nadolol 3. Continue Zoloft 4. Do not take any over the counter medication more than 2-3 times a week to avoid rebound headaches 5. Continue headache calendar 6. Follow-up in 3 months, call for any problems

## 2015-05-16 NOTE — Progress Notes (Signed)
NEUROLOGY FOLLOW UP OFFICE NOTE  Dolly Luan Pulling [id number]  HISTORY OF PRESENT ILLNESS: I had the pleasure of seeing Nicholas Tackitt in follow-up in the neurology clinic on 05/16/2015.  The patient was last seen 3 months ago for increased frequency of migraines. She had an initial good response to restarting Nadolol and Zoloft with reduction in headache frequency. Headaches however recurred, dose of Zoloft increased to /day on last visit. She was doing good in July and August, but over the past month "went crazy." She has only had 6 headache-free days, with 11 moderate headaches and 3 severe ones in the past month. She has had associated nausea, lightheadedness. She takes Maxalt, sometimes with good effect. She reports having a sinus infection and bronchitis at the end of August, and was on 3 different antibiotics. She reports sleep is good, but has been told she snores loudly. She denies any vision changes, no focal numbness/tingling/weakness, no tinnitus, no palpitations.   HPI: This is a pleasant 34 yo RH woman with a history of hypothyroidism and migraines since age 77, who presented with a change in headaches and dizziness. She was first diagnosed with migraines at age 33, initially occurring often, then became less frequent with a headache-free period of up to 2 years at a time. In July 2015, she started having headaches which is "nothing like I had before, more severe and different." She woke up with nausea and vomiting, dizziness, and her head pounding. She was nauseated the whole weekend with headaches mostly over the right hemisphere and behind the right eye radiating down her neck. She described the pain as sharp stabbing pain on the right temporal region "like an explosion" lasting 5 minutes, leaving her head very sensitive to touch. She has been keeping a headache diary and had right-sided throbbing headaches daily with sharp pains once a week or so. Pain is usually a 3/10. Ibuprofen makes the  pain instantly worse. With more severe headaches, she has dizziness described as spinning, difficulties focusing, with photo/phonophobia and heightened sensitivity to smells. She [facility] 2015 and started Topamax, on  qhs with minimal change. She has not been back for follow-up. She was given Baclofen to take prn, but this has not helped. She denies any side effects on Topamax except for occasional paresthesias in her hands.  Headache triggers include potatoes, white rice and pasta. She recalls trying Anaprox and Prozac at age 13, this made her drowsy. A few years ago, she was started on Sertraline which helped some with the headaches, but with addition of Nadolol, headaches significantly improved. She lost her job 2 years ago and stopped medication. She has tried Imitrex, Relpax, Reglan, which caused different side effects. No family history of migraines. She denies any head injuries.  Diagnostic Data: I personally reviewed MRI brain without contrast which was normal.  Tried: Elavil, Topamax  PAST MEDICAL HISTORY: Past Medical History  Diagnosis Date  . Thyroid disease   . Vitamin D deficiency     MEDICATIONS: Current Outpatient Prescriptions on File Prior to Visit  Medication Sig Dispense Refill  . nadolol (CORGARD) 20 MG tablet Take 1 tablet (20 mg total) by mouth daily. (Patient taking differently: Take 10 mg by mouth daily. Take 1/2 tablet daily) 30 tablet 5  . rizatriptan (MAXALT-MLT) 5 MG disintegrating tablet Take 1 tablet (5 mg total) by mouth as needed for migraine. May repeat in 2 hours if needed 10 tablet 3  . SYNTHROID 112 MCG  tablet TAKE 2 TABLETS DAILY BEFORE BREAKFAST (224 MG) 180 tablet 0  . tiZANidine (ZANAFLEX) 4 MG tablet Take 1 tablet (4 mg total) by mouth every 8 (eight) hours as needed for muscle spasms. 30 tablet 2   No current facility-administered medications on file prior to visit.    ALLERGIES: Allergies  Allergen Reactions    . Imitrex [Sumatriptan]   . Reglan [Metoclopramide]   . Relpax [Eletriptan]     FAMILY HISTORY: Family History  Problem Relation Age of Onset  . Fibromyalgia Mother   . Hypertension Mother   . Hypertension Father   . Thyroid disease Brother   . Hyperlipidemia Maternal Grandmother   . Diabetes Paternal Grandfather   . Stroke Paternal Grandfather   . Hypertension Paternal Grandfather     SOCIAL HISTORY: Social History   Social History  . Marital Status: Married    Spouse Name: N/A  . Number of Children: N/A  . Years of Education: N/A   Occupational History  . Not on file.   Social History Main Topics  . Smoking status: Never Smoker   . Smokeless tobacco: Not on file  . Alcohol Use: No  . Drug Use: No  . Sexual Activity: Not on file   Other Topics Concern  . Not on file   Social History Narrative    REVIEW OF SYSTEMS: Constitutional: No fevers, chills, or sweats, no generalized fatigue, change in appetite Eyes: No visual changes, double vision, eye pain Ear, nose and throat: No hearing loss, ear pain, nasal congestion, sore throat Cardiovascular: No chest pain, palpitations Respiratory:  No shortness of breath at rest or with exertion, wheezes GastrointestinaI: No nausea, vomiting, diarrhea, abdominal pain, fecal incontinence Genitourinary:  No dysuria, urinary retention or frequency Musculoskeletal:  No neck pain, back pain Integumentary: No rash, pruritus, skin lesions Neurological: as above Psychiatric: No depression, insomnia, anxiety Endocrine: No palpitations, fatigue, diaphoresis, mood swings, change in appetite, change in weight, increased thirst Hematologic/Lymphatic:  No anemia, purpura, petechiae. Allergic/Immunologic: no itchy/runny eyes, nasal congestion, recent allergic reactions, rashes  PHYSICAL EXAM: Filed Vitals:   05/16/15 1541  BP: 120/82  Pulse: 75   General: No acute distress Head:  Normocephalic/atraumatic Neck: supple, no  paraspinal tenderness, full range of motion Heart:  Regular rate and rhythm Lungs:  Clear to auscultation bilaterally Back: No paraspinal tenderness Skin/Extremities: No rash, no edema Neurological Exam: alert and oriented to person, place, and time. No aphasia or dysarthria. Fund of knowledge is appropriate.  Recent and remote memory are intact.  Attention and concentration are normal.    Able to name objects and repeat phrases. Cranial nerves: Pupils equal, round, reactive to light.  Fundoscopic exam unremarkable, no papilledema. Extraocular movements intact with no nystagmus. Visual fields full. Facial sensation intact. No facial asymmetry. Tongue, uvula, palate midline.  Motor: Bulk and tone normal, muscle strength 5/5 throughout with no pronator drift.  Sensation to light touch intact.  No extinction to double simultaneous stimulation.  Deep tendon reflexes 2+ throughout, toes downgoing.  Finger to nose testing intact.  Gait narrow-based and steady, able to tandem walk adequately.  Romberg negative.  IMPRESSION: This is a 34 yo RH woman with a history of hypothyroidism and migraines who presented with a change in headache quality and frequency, mostly localized over the right hemisphere. Neurological exam and MRI brain normal. She had an initial good response to restarting combination Sertraline and Nadolol, however headaches have recurred, with side effects on higher dose Nadolol. She is  on Nadolol 10mg  daily. Continue Sertraline 100mg  daily. We discussed addition of a different headache preventative medication, and tapering off Nadolol. We discussed options, she will start Zonisamide 100mg  qhs x 2 weeks, then increase to 200mg  qhs. Side effects were discussed. After 2 weeks on Zonisamide, she will stop Nadolol. She knows to minimize any headache rescue medication to 2-3 times a week to avoid rebound headaches. Flexeril helps with neck pain, continue prn for muscle spasms/neck pain. She will continue  to keep a headache diary and follow-up in 3 months.  Thank you for allowing me to participate in her care.  Please do not hesitate to call for any questions or concerns.  The duration of this appointment visit was 25 minutes of face-to-face time with the patient.  Greater than 50% of this time was spent in counseling, explanation of diagnosis, planning of further management, and coordination of care.   Patrcia DollyKaren Aquino, M.D.   CC: Dr. Sedalia Mutaox

## 2015-06-20 ENCOUNTER — Other Ambulatory Visit: Payer: Self-pay | Admitting: Internal Medicine

## 2015-08-19 ENCOUNTER — Encounter: Payer: Self-pay | Admitting: Neurology

## 2015-08-19 ENCOUNTER — Ambulatory Visit (INDEPENDENT_AMBULATORY_CARE_PROVIDER_SITE_OTHER): Payer: BLUE CROSS/BLUE SHIELD | Admitting: Neurology

## 2015-08-19 VITALS — BP 134/82 | HR 98 | Ht 67.5 in | Wt 307.0 lb

## 2015-08-19 DIAGNOSIS — G43009 Migraine without aura, not intractable, without status migrainosus: Secondary | ICD-10-CM | POA: Diagnosis not present

## 2015-08-19 MED ORDER — RIZATRIPTAN BENZOATE 5 MG PO TBDP: 5.0000 mg | ORAL_TABLET | ORAL | Status: DC | PRN

## 2015-08-19 MED ORDER — ZONISAMIDE 100 MG PO CAPS: ORAL_CAPSULE | ORAL | Status: DC

## 2015-08-19 NOTE — Patient Instructions (Signed)
1. Once stress has leveled out, try increasing Zonegran again to  daily. If still not tolerating higher dose, call our office and we will plan to start gabapentin 2. Take Maxalt and Tylenol as needed at onset of migraine. Do not take more than 2 a week 3. Consider physical therapy for neck pain in the future 4. Keep headache diary, follow-up in 8 months, call for any problems

## 2015-08-19 NOTE — Progress Notes (Signed)
NEUROLOGY FOLLOW UP OFFICE NOTE  Ireanna Luan Pulling [id number]  HISTORY OF PRESENT ILLNESS: I had the pleasure of seeing Calie Ureste in follow-up in the neurology clinic on [id number].  The patient was last seen 3 months ago for increased frequency of migraines. She had an initial good response to restarting Nadolol and Zoloft with reduction in headache frequency. Headaches however recurred, dose of Zoloft increased to /day on last visit. She initially did well, but headaches again recurred. She had side effects on higher dose Nadolol. She was started on Zonegran and tapered off Nadolol. She was shaking really bad with  dose of Zonegran, and reduced to  qhs which she is tolerating better. She reports that she now has panic attacks with the more severe migraines, and feels that a lot of her symptoms are related to stress. She has moved down in her job and does feel less stress. She has 3-6 migraines a week, taking Maxalt with Tylenol 1-2 times a week. She has also noticed that there is a sore spot on the left side of her neck/shoulder, she has neck tightness. She has dizziness with the headaches. She has nausea, at times occurring independently of the migraines. She denies any focal numbness/tingling/weakness, no vision changes, no falls.   HPI: This is a pleasant 35 yo RH woman with a history of hypothyroidism and migraines since age 60, who presented with a change in headaches and dizziness. She was first diagnosed with migraines at age 33, initially occurring often, then became less frequent with a headache-free period of up to 2 years at a time. In July 2015, she started having headaches which is "nothing like I had before, more severe and different." She woke up with nausea and vomiting, dizziness, and her head pounding. She was nauseated the whole weekend with headaches mostly over the right hemisphere and behind the right eye radiating down her neck. She described the pain as sharp stabbing pain  on the right temporal region "like an explosion" lasting 5 minutes, leaving her head very sensitive to touch. She has been keeping a headache diary and had right-sided throbbing headaches daily with sharp pains once a week or so. Pain is usually a 3/10. Ibuprofen makes the pain instantly worse. With more severe headaches, she has dizziness described as spinning, difficulties focusing, with photo/phonophobia and heightened sensitivity to smells. She [facility] in September 2015 and started Topamax, on  qhs with minimal change. She has not been back for follow-up. She was given Baclofen to take prn, but this has not helped. She denies any side effects on Topamax except for occasional paresthesias in her hands.  Headache triggers include potatoes, white rice and pasta. She recalls trying Anaprox and Prozac at age 17, this made her drowsy. A few years ago, she was started on Sertraline which helped some with the headaches, but with addition of Nadolol, headaches significantly improved. She lost her job 2 years ago and stopped medication. She has tried Imitrex, Relpax, Reglan, which caused different side effects. No family history of migraines. She denies any head injuries.  Diagnostic Data: I personally reviewed MRI brain without contrast which was normal.  Tried: Elavil, Topamax  PAST MEDICAL HISTORY: Past Medical History  Diagnosis Date  . Thyroid disease   . Vitamin D deficiency     MEDICATIONS: Current Outpatient Prescriptions on File Prior to Visit  Medication Sig Dispense Refill  . b complex vitamins tablet Take 1 tablet by mouth daily.    Marland Kitchen  ergocalciferol (VITAMIN D2) 50000 UNITS capsule Take 50,000 Units by mouth once a week.    . Multiple Vitamin tablet Take 1 tablet by mouth daily.    . rizatriptan (MAXALT-MLT) 5 MG disintegrating tablet Take 1 tablet (5 mg total) by mouth as needed for migraine. May repeat in 2 hours if needed 10 tablet 3  . sertraline (ZOLOFT) 100 MG  tablet 100 mg. Take 1 tablet daily    . SYNTHROID 112 MCG tablet TAKE 2 TABLETS BY MOUTH DAILY BEFORE BREAKFAST (224 MCG TOTAL) 180 tablet 0  . tiZANidine (ZANAFLEX) 4 MG tablet Take 1 tablet (4 mg total) by mouth every 8 (eight) hours as needed for muscle spasms. 30 tablet 2  . zonisamide (ZONEGRAN) 100 MG capsule Take 1 capsule at night for 2 weeks, then increase to 2 capsules at night (Patient taking differently: Take 1 capsule every night) 60 capsule 4   No current facility-administered medications on file prior to visit.    ALLERGIES: Allergies  Allergen Reactions  . Imitrex [Sumatriptan]   . Reglan [Metoclopramide]   . Relpax [Eletriptan]     FAMILY HISTORY: Family History  Problem Relation Age of Onset  . Fibromyalgia Mother   . Hypertension Mother   . Hypertension Father   . Thyroid disease Brother   . Hyperlipidemia Maternal Grandmother   . Diabetes Paternal Grandfather   . Stroke Paternal Grandfather   . Hypertension Paternal Grandfather     SOCIAL HISTORY: Social History   Social History  . Marital Status: Married    Spouse Name: N/A  . Number of Children: N/A  . Years of Education: N/A   Occupational History  . Not on file.   Social History Main Topics  . Smoking status: Never Smoker   . Smokeless tobacco: Not on file  . Alcohol Use: No  . Drug Use: No  . Sexual Activity: Not on file   Other Topics Concern  . Not on file   Social History Narrative    REVIEW OF SYSTEMS: Constitutional: No fevers, chills, or sweats, no generalized fatigue, change in appetite Eyes: No visual changes, double vision, eye pain Ear, nose and throat: No hearing loss, ear pain, nasal congestion, sore throat Cardiovascular: No chest pain, palpitations Respiratory:  No shortness of breath at rest or with exertion, wheezes GastrointestinaI: No nausea, vomiting, diarrhea, abdominal pain, fecal incontinence Genitourinary:  No dysuria, urinary retention or  frequency Musculoskeletal:  No neck pain, back pain Integumentary: No rash, pruritus, skin lesions Neurological: as above Psychiatric: No depression, insomnia,+ anxiety Endocrine: No palpitations, fatigue, diaphoresis, mood swings, change in appetite, change in weight, increased thirst Hematologic/Lymphatic:  No anemia, purpura, petechiae. Allergic/Immunologic: no itchy/runny eyes, nasal congestion, recent allergic reactions, rashes  PHYSICAL EXAM: Filed Vitals:   08/19/15 1533  BP: 134/82  Pulse: 98   General: No acute distress Head:  Normocephalic/atraumatic Neck: supple, no paraspinal tenderness, full range of motion Heart:  Regular rate and rhythm Lungs:  Clear to auscultation bilaterally Back: No paraspinal tenderness Skin/Extremities: No rash, no edema Neurological Exam: alert and oriented to person, place, and time. No aphasia or dysarthria. Fund of knowledge is appropriate.  Recent and remote memory are intact.  Attention and concentration are normal.    Able to name objects and repeat phrases. Cranial nerves: Pupils equal, round, reactive to light.  Fundoscopic exam unremarkable, no papilledema. Extraocular movements intact with no nystagmus. Visual fields full. Facial sensation intact. No facial asymmetry. Tongue, uvula, palate midline.  Motor: Bulk  and tone normal, muscle strength 5/5 throughout with no pronator drift.  Sensation to light touch intact.  No extinction to double simultaneous stimulation.  Deep tendon reflexes 2+ throughout, toes downgoing.  Finger to nose testing intact.  Gait narrow-based and steady, able to tandem walk adequately.  Romberg negative.  IMPRESSION: This is a 35 yo RH woman with a history of hypothyroidism and migraines who presented with a change in headache quality and frequency, mostly localized over the right hemisphere. Neurological exam and MRI brain normal. She had an initial good response to restarting combination Sertraline and Nadolol,  however headaches have recurred, with side effects on higher dose Nadolol. She has tapered off Nadolol and currently takes Sertraline  daily and Zonegran  qhs. She has noticed that stress is playing a factor with her migraines and has changed her job. Once stress has leveled out, she would like to try increasing dose of Zonegran again to  qhs. If still having side effects, she will be switched to gabapentin. Botox may be considered in the future. She knows to minimize any headache rescue medication to 2-3 times a week to avoid rebound headaches. Flexeril helps with neck pain, continue prn for muscle spasms/neck pain. She would like to hold off on neck PT for now. She will continue to keep a headache diary and follow-up in 8 months.  Thank you for allowing me to participate in her care.  Please do not hesitate to call for any questions or concerns.  The duration of this appointment visit was 25 minutes of face-to-face time with the patient.  Greater than 50% of this time was spent in counseling, explanation of diagnosis, planning of further management, and coordination of care.   Patrcia Dolly, M.D.   CC: Dr. Sedalia Muta

## 2015-12-05 ENCOUNTER — Other Ambulatory Visit: Payer: Self-pay | Admitting: Internal Medicine

## 2015-12-16 DIAGNOSIS — G43109 Migraine with aura, not intractable, without status migrainosus: Secondary | ICD-10-CM | POA: Diagnosis not present

## 2015-12-19 DIAGNOSIS — R51 Headache: Secondary | ICD-10-CM | POA: Diagnosis not present

## 2015-12-19 DIAGNOSIS — R002 Palpitations: Secondary | ICD-10-CM | POA: Diagnosis not present

## 2015-12-19 DIAGNOSIS — M62838 Other muscle spasm: Secondary | ICD-10-CM | POA: Diagnosis not present

## 2015-12-19 DIAGNOSIS — R030 Elevated blood-pressure reading, without diagnosis of hypertension: Secondary | ICD-10-CM | POA: Diagnosis not present

## 2015-12-25 DIAGNOSIS — E039 Hypothyroidism, unspecified: Secondary | ICD-10-CM | POA: Diagnosis not present

## 2016-02-12 DIAGNOSIS — E039 Hypothyroidism, unspecified: Secondary | ICD-10-CM | POA: Diagnosis not present

## 2016-04-19 ENCOUNTER — Ambulatory Visit: Payer: BLUE CROSS/BLUE SHIELD | Admitting: Neurology

## 2016-05-27 DIAGNOSIS — E039 Hypothyroidism, unspecified: Secondary | ICD-10-CM | POA: Diagnosis not present

## 2016-09-28 DIAGNOSIS — E039 Hypothyroidism, unspecified: Secondary | ICD-10-CM | POA: Diagnosis not present

## 2017-01-10 DIAGNOSIS — N3000 Acute cystitis without hematuria: Secondary | ICD-10-CM | POA: Diagnosis not present

## 2017-02-05 DIAGNOSIS — J069 Acute upper respiratory infection, unspecified: Secondary | ICD-10-CM | POA: Diagnosis not present

## 2017-02-05 DIAGNOSIS — B9789 Other viral agents as the cause of diseases classified elsewhere: Secondary | ICD-10-CM | POA: Diagnosis not present

## 2017-02-05 DIAGNOSIS — J029 Acute pharyngitis, unspecified: Secondary | ICD-10-CM | POA: Diagnosis not present

## 2017-03-08 DIAGNOSIS — E039 Hypothyroidism, unspecified: Secondary | ICD-10-CM | POA: Diagnosis not present

## 2017-03-08 DIAGNOSIS — Z1329 Encounter for screening for other suspected endocrine disorder: Secondary | ICD-10-CM | POA: Diagnosis not present

## 2017-06-14 DIAGNOSIS — M549 Dorsalgia, unspecified: Secondary | ICD-10-CM | POA: Diagnosis not present

## 2017-06-14 DIAGNOSIS — R7302 Impaired glucose tolerance (oral): Secondary | ICD-10-CM | POA: Diagnosis not present

## 2017-06-14 DIAGNOSIS — E039 Hypothyroidism, unspecified: Secondary | ICD-10-CM | POA: Diagnosis not present

## 2017-07-21 DIAGNOSIS — J00 Acute nasopharyngitis [common cold]: Secondary | ICD-10-CM | POA: Diagnosis not present

## 2017-08-16 DIAGNOSIS — E039 Hypothyroidism, unspecified: Secondary | ICD-10-CM | POA: Diagnosis not present

## 2018-02-07 DIAGNOSIS — E039 Hypothyroidism, unspecified: Secondary | ICD-10-CM | POA: Diagnosis not present

## 2018-02-14 DIAGNOSIS — E039 Hypothyroidism, unspecified: Secondary | ICD-10-CM | POA: Diagnosis not present

## 2018-04-18 DIAGNOSIS — E039 Hypothyroidism, unspecified: Secondary | ICD-10-CM | POA: Diagnosis not present

## 2018-05-31 DIAGNOSIS — J029 Acute pharyngitis, unspecified: Secondary | ICD-10-CM | POA: Diagnosis not present

## 2018-08-14 DIAGNOSIS — E039 Hypothyroidism, unspecified: Secondary | ICD-10-CM | POA: Diagnosis not present

## 2019-02-06 DIAGNOSIS — E039 Hypothyroidism, unspecified: Secondary | ICD-10-CM | POA: Diagnosis not present

## 2019-02-13 DIAGNOSIS — E039 Hypothyroidism, unspecified: Secondary | ICD-10-CM | POA: Diagnosis not present

## 2019-02-13 DIAGNOSIS — R000 Tachycardia, unspecified: Secondary | ICD-10-CM | POA: Diagnosis not present

## 2019-03-11 DIAGNOSIS — R062 Wheezing: Secondary | ICD-10-CM | POA: Diagnosis not present

## 2019-03-11 DIAGNOSIS — Z20828 Contact with and (suspected) exposure to other viral communicable diseases: Secondary | ICD-10-CM | POA: Diagnosis not present

## 2019-10-23 ENCOUNTER — Encounter: Payer: Self-pay | Admitting: Nurse Practitioner

## 2019-10-23 ENCOUNTER — Other Ambulatory Visit: Payer: Self-pay

## 2019-10-23 ENCOUNTER — Other Ambulatory Visit: Payer: Self-pay | Admitting: Nurse Practitioner

## 2019-10-23 ENCOUNTER — Ambulatory Visit: Payer: BC Managed Care – PPO | Admitting: Nurse Practitioner

## 2019-10-23 VITALS — BP 142/82 | HR 97 | Temp 97.7°F | Ht 67.5 in | Wt 329.0 lb

## 2019-10-23 DIAGNOSIS — R1030 Lower abdominal pain, unspecified: Secondary | ICD-10-CM | POA: Diagnosis not present

## 2019-10-23 DIAGNOSIS — N3000 Acute cystitis without hematuria: Secondary | ICD-10-CM | POA: Diagnosis not present

## 2019-10-23 DIAGNOSIS — N898 Other specified noninflammatory disorders of vagina: Secondary | ICD-10-CM | POA: Diagnosis not present

## 2019-10-23 LAB — POCT URINALYSIS DIPSTICK
Bilirubin, UA: NEGATIVE
Blood, UA: NEGATIVE
Glucose, UA: NEGATIVE
Ketones, UA: NEGATIVE
Leukocytes, UA: NEGATIVE
Nitrite, UA: NEGATIVE
Protein, UA: NEGATIVE
Spec Grav, UA: 1.025 (ref 1.010–1.025)
Urobilinogen, UA: NEGATIVE E.U./dL — AB
pH, UA: 6 (ref 5.0–8.0)

## 2019-10-23 MED ORDER — NITROFURANTOIN MONOHYD MACRO 100 MG PO CAPS: 100.0000 mg | ORAL_CAPSULE | Freq: Two times a day (BID) | ORAL | 0 refills | Status: DC

## 2019-10-23 NOTE — Patient Instructions (Signed)
Urinary Tract Infection, Adult A urinary tract infection (UTI) is an infection of any part of the urinary tract. The urinary tract includes:  The kidneys.  The ureters.  The bladder.  The urethra. These organs make, store, and get rid of pee (urine) in the body. What are the causes? This is caused by germs (bacteria) in your genital area. These germs grow and cause swelling (inflammation) of your urinary tract. What increases the risk? You are more likely to develop this condition if:  You have a small, thin tube (catheter) to drain pee.  You cannot control when you pee or poop (incontinence).  You are female, and: ? You use these methods to prevent pregnancy:  A medicine that kills sperm (spermicide).  A device that blocks sperm (diaphragm). ? You have low levels of a female hormone (estrogen). ? You are pregnant.  You have genes that add to your risk.  You are sexually active.  You take antibiotic medicines.  You have trouble peeing because of: ? A prostate that is bigger than normal, if you are female. ? A blockage in the part of your body that drains pee from the bladder (urethra). ? A kidney stone. ? A nerve condition that affects your bladder (neurogenic bladder). ? Not getting enough to drink. ? Not peeing often enough.  You have other conditions, such as: ? Diabetes. ? A weak disease-fighting system (immune system). ? Sickle cell disease. ? Gout. ? Injury of the spine. What are the signs or symptoms? Symptoms of this condition include:  Needing to pee right away (urgently).  Peeing often.  Peeing small amounts often.  Pain or burning when peeing.  Blood in the pee.  Pee that smells bad or not like normal.  Trouble peeing.  Pee that is cloudy.  Fluid coming from the vagina, if you are female.  Pain in the belly or lower back. Other symptoms include:  Throwing up (vomiting).  No urge to eat.  Feeling mixed up (confused).  Being tired  and grouchy (irritable).  A fever.  Watery poop (diarrhea). How is this treated? This condition may be treated with:  Antibiotic medicine.  Other medicines.  Drinking enough water. Follow these instructions at home:  Medicines  Take over-the-counter and prescription medicines only as told by your doctor.  If you were prescribed an antibiotic medicine, take it as told by your doctor. Do not stop taking it even if you start to feel better. General instructions  Make sure you: ? Pee until your bladder is empty. ? Do not hold pee for a long time. ? Empty your bladder after sex. ? Wipe from front to back after pooping if you are a female. Use each tissue one time when you wipe.  Drink enough fluid to keep your pee pale yellow.  Keep all follow-up visits as told by your doctor. This is important. Contact a doctor if:  You do not get better after 1-2 days.  Your symptoms go away and then come back. Get help right away if:  You have very bad back pain.  You have very bad pain in your lower belly.  You have a fever.  You are sick to your stomach (nauseous).  You are throwing up. Summary  A urinary tract infection (UTI) is an infection of any part of the urinary tract.  This condition is caused by germs in your genital area.  There are many risk factors for a UTI. These include having a small, thin   tube to drain pee and not being able to control when you pee or poop.  Treatment includes antibiotic medicines for germs.  Drink enough fluid to keep your pee pale yellow. This information is not intended to replace advice given to you by your health care provider. Make sure you discuss any questions you have with your health care provider. Document Revised: 06/22/2018 Document Reviewed: 01/12/2018 [employer] Patient Education  2020 [employer]  

## 2019-10-23 NOTE — Progress Notes (Deleted)
Acute Office Visit  Subjective:    Patient ID: Courtney Miranda, female    DOB: May 02, 1981, 39 y.o.   MRN: [id number]  Chief Complaint  Patient presents with  . Urinary Tract Infection    Lower abdominal pain/ nausea   Patient  is a 39 year old female.  She is here for  urinary tract infection. The patient's medications were reviewed and reconciled since the patient's last visit.  History details were provided by the patient. The history appears to be reliable.    Urinary Tract Infection  This is a new problem. The current episode started in the past 7 days. The problem occurs every urination. The problem has been unchanged. There has been no fever. She is sexually active. There is no history of pyelonephritis. Associated symptoms include nausea and urgency. Pertinent negatives include no flank pain or hematuria. Associated symptoms comments: Foul odor urine. She has tried nothing for the symptoms. There is no history of catheterization or recurrent UTIs.    Past Medical History:  Diagnosis Date  . Thyroid disease   . Vitamin D deficiency     Past Surgical History:  Procedure Laterality Date  . CESAREAN SECTION  2002    Family History  Problem Relation Age of Onset  . Fibromyalgia Mother   . Hypertension Mother   . Hypertension Father   . Thyroid disease Brother   . Hyperlipidemia Maternal Grandmother   . Diabetes Paternal Grandfather   . Stroke Paternal Grandfather   . Hypertension Paternal Grandfather     Social History   Socioeconomic History  . Marital status: Married    Spouse name: Not on file  . Number of children: 1  . Years of education: Not on file  . Highest education level: Not on file  Occupational History  . Not on file  Tobacco Use  . Smoking status: Never Smoker  . Smokeless tobacco: Never Used  Substance and Sexual Activity  . Alcohol use: No    Alcohol/week: 0.0 standard drinks  . Drug use: No  . Sexual activity: Not on file  Other Topics Concern    . Not on file  Social History Narrative  . Not on file   Social Determinants of Health   Financial Resource Strain:   . Difficulty of Paying Living Expenses:   Food Insecurity:   . Worried About Charity fundraiser in the Last Year:   . Arboriculturist in the Last Year:   Transportation Needs:   . Film/video editor (Medical):   Marland Kitchen Lack of Transportation (Non-Medical):   Physical Activity:   . Days of Exercise per Week:   . Minutes of Exercise per Session:   Stress:   . Feeling of Stress :   Social Connections:   . Frequency of Communication with Friends and Family:   . Frequency of Social Gatherings with Friends and Family:   . Attends Religious Services:   . Active Member of Clubs or Organizations:   . Attends Archivist Meetings:   Marland Kitchen Marital Status:   Intimate Partner Violence:   . Fear of Current or Ex-Partner:   . Emotionally Abused:   Marland Kitchen Physically Abused:   . Sexually Abused:     Outpatient Medications Prior to Visit  Medication Sig Dispense Refill  . Multiple Vitamin tablet Take 1 tablet by mouth daily.    Marland Kitchen SYNTHROID 112 MCG tablet TAKE 2 TABLETS BY MOUTH DAILY BEFORE BREAKFAST. 180 tablet 0  .  b complex vitamins tablet Take 1 tablet by mouth daily.    . ergocalciferol (VITAMIN D2) 50000 UNITS capsule Take 50,000 Units by mouth once a week.    . rizatriptan (MAXALT-MLT) 5 MG disintegrating tablet Take 1 tablet (5 mg total) by mouth as needed for migraine. May repeat in 2 hours if needed 10 tablet 11  . sertraline (ZOLOFT) 100 MG tablet 100 mg. Take 1 tablet daily    . tiZANidine (ZANAFLEX) 4 MG tablet Take 1 tablet (4 mg total) by mouth every 8 (eight) hours as needed for muscle spasms. 30 tablet 2  . zonisamide (ZONEGRAN) 100 MG capsule Take 2 capsules at night 60 capsule 11   No facility-administered medications prior to visit.    Allergies  Allergen Reactions  . Eletriptan Other (See Comments)  . Metoclopramide Other (See Comments)  .  Sumatriptan Other (See Comments)    Review of Systems  Constitutional: Negative for activity change, appetite change and fatigue.  Respiratory: Negative for chest tightness and shortness of breath.   Cardiovascular: Negative for chest pain and palpitations.  Gastrointestinal: Positive for abdominal pain and nausea.       Lower abdominal pain  Genitourinary: Positive for urgency and vaginal discharge. Negative for difficulty urinating, flank pain, hematuria, pelvic pain, vaginal bleeding and vaginal pain.  Musculoskeletal: Negative for myalgias.  Skin: Negative for rash.  Neurological: Negative for headaches.  Psychiatric/Behavioral: Negative for behavioral problems.       Objective:    Physical Exam Constitutional:      Appearance: Normal appearance.  HENT:     Head: Normocephalic.     Nose: No congestion.  Eyes:     Conjunctiva/sclera: Conjunctivae normal.  Cardiovascular:     Rate and Rhythm: Normal rate and regular rhythm.     Pulses: Normal pulses.     Heart sounds: Normal heart sounds.  Pulmonary:     Effort: Pulmonary effort is normal.     Breath sounds: Normal breath sounds.  Abdominal:     General: Bowel sounds are normal.     Tenderness: There is abdominal tenderness.     Comments: Lower abdominal pain  Musculoskeletal:        General: No tenderness.     Cervical back: Neck supple.  Skin:    General: Skin is warm.     Findings: No erythema.  Neurological:     Motor: No weakness.     BP (!) 142/82 (BP Location: Left Arm, Patient Position: Sitting)   Pulse 97   Temp 97.7 F (36.5 C) (Temporal)   Ht 5' 7.5" (1.715 m)   Wt (!) 329 lb (149.2 kg)   SpO2 99%   BMI 50.77 kg/m  Wt Readings from Last 3 Encounters:  10/23/19 (!) 329 lb (149.2 kg)  08/19/15 (!) 307 lb (139.3 kg)  05/16/15 (!) 303 lb (137.4 kg)    Health Maintenance Due  Topic Date Due  . HIV Screening  Never done  . COVID-19 Vaccine (1) Never done  . TETANUS/TDAP  Never done  . PAP  SMEAR-Modifier  Never done    There are no preventive care reminders to display for this patient.   Lab Results  Component Value Date   TSH 1.70 04/30/2015      Assessment & Plan:   Problem List Items Addressed This Visit      Genitourinary   Acute cystitis without hematuria    Provided education for UTI, the need to increase fluid, Macrobid started 100  mg daily, Ibuprofen for pelvic pain. Specimen collected to rule out Bacteria vaginosis and chlamydia and gonorrhea for patients complain of vaginal discharge, odor and lowe abdominal pain. UA collected, patient knows to follow up  As needed for worsening sumptoms      Relevant Medications   nitrofurantoin, macrocrystal-monohydrate, (MACROBID) 100 MG capsule   Other Relevant Orders   POCT urinalysis dipstick (Completed)     Other   Lower abdominal pain - Primary    Not well controlled, Labs obtained to rule out any possible causes of lower abdominal pain and UTI. Over the counter pain medication; ibuprofen or tylenol recommended for pain relief.      Vaginal discharge    Not well controlled, vaginal swab and specimen collected to rule out BV on wet mount /swift test.      Relevant Orders   GC/Chlamydia Probe Amp([facility]) (Completed)   POCT OSOM BV Test       Meds ordered this encounter  Medications  . nitrofurantoin, macrocrystal-monohydrate, (MACROBID) 100 MG capsule    Sig: Take 1 capsule (100 mg total) by mouth 2 (two) times daily.    Dispense:  14 capsule    Refill:  0    Order Specific Question:   Supervising Provider    AnswerCorey Harold     Daryll Drown, NP

## 2019-10-23 NOTE — Assessment & Plan Note (Addendum)
Provided education for UTI, the need to increase fluid, Macrobid started 100 mg daily, Ibuprofen for pelvic pain. Specimen collected to rule out Bacteria vaginosis and chlamydia and gonorrhea for patients complain of vaginal discharge, odor and lowe abdominal pain. UA collected, patient knows to follow up  As needed for worsening sumptoms

## 2019-10-24 LAB — GC/CHLAMYDIA PROBE AMP
Chlamydia trachomatis, NAA: NEGATIVE
Neisseria Gonorrhoeae by PCR: NEGATIVE

## 2019-11-05 LAB — POCT OSOM BVBLUE TEST: Bacterial Vaginosis: 0

## 2019-11-05 NOTE — Assessment & Plan Note (Signed)
Not well controlled, vaginal swab and specimen collected to rule out BV on wet mount /swift test.

## 2019-11-05 NOTE — Assessment & Plan Note (Signed)
Not well controlled, Labs obtained to rule out any possible causes of lower abdominal pain and UTI. Over the counter pain medication; ibuprofen or tylenol recommended for pain relief.

## 2019-11-05 NOTE — Progress Notes (Signed)
Acute Office Visit  Subjective:    Patient ID: Courtney Miranda, female    DOB: Mar 07, 1981, 39 y.o.   MRN: [id number]  Chief Complaint  Patient presents with  . Urinary Tract Infection    Lower abdominal pain/ nausea   Patient  is a 39 year old female.  She is here for  urinary tract infection. The patient's medications were reviewed and reconciled since the patient's last visit.  History details were provided by the patient. The history appears to be reliable.     HPI Urinary Tract Infection  This is a new problem. The current episode started in the past 7 days. The problem occurs every urination. The problem has been unchanged. There has been no fever. She is sexually active. There is no history of pyelonephritis. Associated symptoms include nausea and urgency. Pertinent negatives include no flank pain or hematuria. Associated symptoms comments: Foul odor urine. She has tried nothing for the symptoms. There is no history of catheterization or recurrent UTIs.   Chief Complaint  Patient presents with  . Urinary Tract Infection    Lower abdominal pain/ nausea       Past Medical History:  Diagnosis Date  . Thyroid disease   . Vitamin D deficiency     Past Surgical History:  Procedure Laterality Date  . CESAREAN SECTION  2002    Family History  Problem Relation Age of Onset  . Fibromyalgia Mother   . Hypertension Mother   . Hypertension Father   . Thyroid disease Brother   . Hyperlipidemia Maternal Grandmother   . Diabetes Paternal Grandfather   . Stroke Paternal Grandfather   . Hypertension Paternal Grandfather     Social History   Socioeconomic History  . Marital status: Married    Spouse name: Not on file  . Number of children: 1  . Years of education: Not on file  . Highest education level: Not on file  Occupational History  . Not on file  Tobacco Use  . Smoking status: Never Smoker  . Smokeless tobacco: Never Used  Substance and Sexual Activity  . Alcohol  use: No    Alcohol/week: 0.0 standard drinks  . Drug use: No  . Sexual activity: Not on file  Other Topics Concern  . Not on file  Social History Narrative  . Not on file   Social Determinants of Health   Financial Resource Strain:   . Difficulty of Paying Living Expenses:   Food Insecurity:   . Worried About Charity fundraiser in the Last Year:   . Arboriculturist in the Last Year:   Transportation Needs:   . Film/video editor (Medical):   Marland Kitchen Lack of Transportation (Non-Medical):   Physical Activity:   . Days of Exercise per Week:   . Minutes of Exercise per Session:   Stress:   . Feeling of Stress :   Social Connections:   . Frequency of Communication with Friends and Family:   . Frequency of Social Gatherings with Friends and Family:   . Attends Religious Services:   . Active Member of Clubs or Organizations:   . Attends Archivist Meetings:   Marland Kitchen Marital Status:   Intimate Partner Violence:   . Fear of Current or Ex-Partner:   . Emotionally Abused:   Marland Kitchen Physically Abused:   . Sexually Abused:     Outpatient Medications Prior to Visit  Medication Sig Dispense Refill  . Multiple Vitamin tablet Take 1  tablet by mouth daily.    Marland Kitchen SYNTHROID 112 MCG tablet TAKE 2 TABLETS BY MOUTH DAILY BEFORE BREAKFAST. 180 tablet 0  . b complex vitamins tablet Take 1 tablet by mouth daily.    . ergocalciferol (VITAMIN D2) 50000 UNITS capsule Take 50,000 Units by mouth once a week.    . rizatriptan (MAXALT-MLT) 5 MG disintegrating tablet Take 1 tablet (5 mg total) by mouth as needed for migraine. May repeat in 2 hours if needed 10 tablet 11  . sertraline (ZOLOFT) 100 MG tablet 100 mg. Take 1 tablet daily    . tiZANidine (ZANAFLEX) 4 MG tablet Take 1 tablet (4 mg total) by mouth every 8 (eight) hours as needed for muscle spasms. 30 tablet 2  . zonisamide (ZONEGRAN) 100 MG capsule Take 2 capsules at night 60 capsule 11   No facility-administered medications prior to visit.     Allergies  Allergen Reactions  . Eletriptan Other (See Comments)  . Metoclopramide Other (See Comments)  . Sumatriptan Other (See Comments)    Review of Systems  Constitutional: Negative for activity change, appetite change and fever.  Respiratory: Negative for chest tightness and shortness of breath.   Gastrointestinal: Positive for abdominal pain.  Genitourinary: Positive for urgency.  Musculoskeletal: Negative for arthralgias and myalgias.  Skin: Negative for rash.  Neurological: Negative for weakness.       Objective:    Physical Exam Constitutional:      Appearance: Normal appearance.  Cardiovascular:     Rate and Rhythm: Normal rate and regular rhythm.  Pulmonary:     Effort: Pulmonary effort is normal.     Breath sounds: Normal breath sounds.  Abdominal:     General: Bowel sounds are normal.  Musculoskeletal:        General: No tenderness.     Cervical back: Neck supple.  Skin:    General: Skin is warm.  Neurological:     Mental Status: She is alert.     BP (!) 142/82 (BP Location: Left Arm, Patient Position: Sitting)   Pulse 97   Temp 97.7 F (36.5 C) (Temporal)   Ht 5' 7.5" (1.715 m)   Wt (!) 329 lb (149.2 kg)   SpO2 99%   BMI 50.77 kg/m  Wt Readings from Last 3 Encounters:  10/23/19 (!) 329 lb (149.2 kg)  08/19/15 (!) 307 lb (139.3 kg)  05/16/15 (!) 303 lb (137.4 kg)    Health Maintenance Due  Topic Date Due  . HIV Screening  Never done  . COVID-19 Vaccine (1) Never done  . TETANUS/TDAP  Never done  . PAP SMEAR-Modifier  Never done    There are no preventive care reminders to display for this patient.   Lab Results  Component Value Date   TSH 1.70 04/30/2015   No results found for: WBC, HGB, HCT, MCV, PLT No results found for: NA, K, CHLORIDE, CO2, GLUCOSE, BUN, CREATININE, BILITOT, ALKPHOS, AST, ALT, PROT, ALBUMIN, CALCIUM, ANIONGAP, EGFR, GFR No results found for: CHOL No results found for: HDL No results found for:  LDLCALC No results found for: TRIG No results found for: CHOLHDL No results found for: HGBA1C     Assessment & Plan:  Acute cystitis without hematuria Provided education for UTI, the need to increase fluid, Macrobid started 100 mg daily, Ibuprofen for pelvic pain. Specimen collected to rule out Bacteria vaginosis and chlamydia and gonorrhea for patients complain of vaginal discharge, odor and lowe abdominal pain. UA collected, patient knows to follow  up  As needed for worsening sumptoms  Lower abdominal pain Not well controlled, Labs obtained to rule out any possible causes of lower abdominal pain and UTI. Over the counter pain medication; ibuprofen or tylenol recommended for pain relief.  Vaginal discharge Not well controlled, vaginal swab and specimen collected to rule out BV on wet mount /swift test.  Problem List Items Addressed This Visit      Genitourinary   Acute cystitis without hematuria - Primary    Provided education for UTI, the need to increase fluid, Macrobid started 100 mg daily, Ibuprofen for pelvic pain. Specimen collected to rule out Bacteria vaginosis and chlamydia and gonorrhea for patients complain of vaginal discharge, odor and lowe abdominal pain. UA collected, patient knows to follow up  As needed for worsening sumptoms      Relevant Medications   nitrofurantoin, macrocrystal-monohydrate, (MACROBID) 100 MG capsule   Other Relevant Orders   POCT urinalysis dipstick (Completed)     Other   Lower abdominal pain    Not well controlled, Labs obtained to rule out any possible causes of lower abdominal pain and UTI. Over the counter pain medication; ibuprofen or tylenol recommended for pain relief.      Vaginal discharge    Not well controlled, vaginal swab and specimen collected to rule out BV on wet mount /swift test.      Relevant Orders   GC/Chlamydia Probe Amp([facility]) (Completed)   POCT OSOM BV Test       Meds ordered this encounter  Medications  .  nitrofurantoin, macrocrystal-monohydrate, (MACROBID) 100 MG capsule    Sig: Take 1 capsule (100 mg total) by mouth 2 (two) times daily.    Dispense:  14 capsule    Refill:  0    Order Specific Question:   Supervising Provider    AnswerShelton Silvas     Ivy Lynn, NP

## 2019-12-02 DIAGNOSIS — J019 Acute sinusitis, unspecified: Secondary | ICD-10-CM | POA: Diagnosis not present

## 2019-12-02 DIAGNOSIS — Z20822 Contact with and (suspected) exposure to covid-19: Secondary | ICD-10-CM | POA: Diagnosis not present

## 2019-12-26 DIAGNOSIS — M9905 Segmental and somatic dysfunction of pelvic region: Secondary | ICD-10-CM | POA: Diagnosis not present

## 2019-12-26 DIAGNOSIS — M542 Cervicalgia: Secondary | ICD-10-CM | POA: Diagnosis not present

## 2019-12-26 DIAGNOSIS — M9901 Segmental and somatic dysfunction of cervical region: Secondary | ICD-10-CM | POA: Diagnosis not present

## 2019-12-26 DIAGNOSIS — M9902 Segmental and somatic dysfunction of thoracic region: Secondary | ICD-10-CM | POA: Diagnosis not present

## 2019-12-28 DIAGNOSIS — M9902 Segmental and somatic dysfunction of thoracic region: Secondary | ICD-10-CM | POA: Diagnosis not present

## 2019-12-28 DIAGNOSIS — M9901 Segmental and somatic dysfunction of cervical region: Secondary | ICD-10-CM | POA: Diagnosis not present

## 2019-12-28 DIAGNOSIS — M9905 Segmental and somatic dysfunction of pelvic region: Secondary | ICD-10-CM | POA: Diagnosis not present

## 2019-12-28 DIAGNOSIS — M542 Cervicalgia: Secondary | ICD-10-CM | POA: Diagnosis not present

## 2020-01-01 DIAGNOSIS — M9902 Segmental and somatic dysfunction of thoracic region: Secondary | ICD-10-CM | POA: Diagnosis not present

## 2020-01-01 DIAGNOSIS — M9901 Segmental and somatic dysfunction of cervical region: Secondary | ICD-10-CM | POA: Diagnosis not present

## 2020-01-01 DIAGNOSIS — M542 Cervicalgia: Secondary | ICD-10-CM | POA: Diagnosis not present

## 2020-01-01 DIAGNOSIS — M9905 Segmental and somatic dysfunction of pelvic region: Secondary | ICD-10-CM | POA: Diagnosis not present

## 2020-01-03 DIAGNOSIS — M9905 Segmental and somatic dysfunction of pelvic region: Secondary | ICD-10-CM | POA: Diagnosis not present

## 2020-01-03 DIAGNOSIS — M9902 Segmental and somatic dysfunction of thoracic region: Secondary | ICD-10-CM | POA: Diagnosis not present

## 2020-01-03 DIAGNOSIS — M9901 Segmental and somatic dysfunction of cervical region: Secondary | ICD-10-CM | POA: Diagnosis not present

## 2020-01-03 DIAGNOSIS — M542 Cervicalgia: Secondary | ICD-10-CM | POA: Diagnosis not present

## 2020-01-09 DIAGNOSIS — M9905 Segmental and somatic dysfunction of pelvic region: Secondary | ICD-10-CM | POA: Diagnosis not present

## 2020-01-09 DIAGNOSIS — M542 Cervicalgia: Secondary | ICD-10-CM | POA: Diagnosis not present

## 2020-01-09 DIAGNOSIS — M9902 Segmental and somatic dysfunction of thoracic region: Secondary | ICD-10-CM | POA: Diagnosis not present

## 2020-01-09 DIAGNOSIS — M9901 Segmental and somatic dysfunction of cervical region: Secondary | ICD-10-CM | POA: Diagnosis not present

## 2020-01-16 DIAGNOSIS — M9902 Segmental and somatic dysfunction of thoracic region: Secondary | ICD-10-CM | POA: Diagnosis not present

## 2020-01-16 DIAGNOSIS — M542 Cervicalgia: Secondary | ICD-10-CM | POA: Diagnosis not present

## 2020-01-16 DIAGNOSIS — M9901 Segmental and somatic dysfunction of cervical region: Secondary | ICD-10-CM | POA: Diagnosis not present

## 2020-01-16 DIAGNOSIS — M9905 Segmental and somatic dysfunction of pelvic region: Secondary | ICD-10-CM | POA: Diagnosis not present

## 2020-01-23 DIAGNOSIS — M542 Cervicalgia: Secondary | ICD-10-CM | POA: Diagnosis not present

## 2020-01-23 DIAGNOSIS — M9905 Segmental and somatic dysfunction of pelvic region: Secondary | ICD-10-CM | POA: Diagnosis not present

## 2020-01-23 DIAGNOSIS — M9901 Segmental and somatic dysfunction of cervical region: Secondary | ICD-10-CM | POA: Diagnosis not present

## 2020-01-23 DIAGNOSIS — M9902 Segmental and somatic dysfunction of thoracic region: Secondary | ICD-10-CM | POA: Diagnosis not present

## 2020-02-06 DIAGNOSIS — E039 Hypothyroidism, unspecified: Secondary | ICD-10-CM | POA: Diagnosis not present

## 2020-02-13 DIAGNOSIS — E039 Hypothyroidism, unspecified: Secondary | ICD-10-CM | POA: Diagnosis not present

## 2020-02-13 DIAGNOSIS — D72829 Elevated white blood cell count, unspecified: Secondary | ICD-10-CM | POA: Diagnosis not present

## 2020-02-13 DIAGNOSIS — R635 Abnormal weight gain: Secondary | ICD-10-CM | POA: Diagnosis not present

## 2020-03-18 DIAGNOSIS — J0100 Acute maxillary sinusitis, unspecified: Secondary | ICD-10-CM | POA: Diagnosis not present

## 2020-03-18 DIAGNOSIS — Z20828 Contact with and (suspected) exposure to other viral communicable diseases: Secondary | ICD-10-CM | POA: Diagnosis not present

## 2020-03-18 DIAGNOSIS — J029 Acute pharyngitis, unspecified: Secondary | ICD-10-CM | POA: Diagnosis not present

## 2020-03-26 DIAGNOSIS — R635 Abnormal weight gain: Secondary | ICD-10-CM | POA: Diagnosis not present

## 2020-03-26 DIAGNOSIS — E039 Hypothyroidism, unspecified: Secondary | ICD-10-CM | POA: Diagnosis not present

## 2020-03-26 DIAGNOSIS — D72829 Elevated white blood cell count, unspecified: Secondary | ICD-10-CM | POA: Diagnosis not present

## 2020-06-03 DIAGNOSIS — R051 Acute cough: Secondary | ICD-10-CM | POA: Diagnosis not present

## 2020-06-03 DIAGNOSIS — R519 Headache, unspecified: Secondary | ICD-10-CM | POA: Diagnosis not present

## 2020-06-03 DIAGNOSIS — Z20828 Contact with and (suspected) exposure to other viral communicable diseases: Secondary | ICD-10-CM | POA: Diagnosis not present

## 2020-07-31 DIAGNOSIS — M9902 Segmental and somatic dysfunction of thoracic region: Secondary | ICD-10-CM | POA: Diagnosis not present

## 2020-07-31 DIAGNOSIS — M542 Cervicalgia: Secondary | ICD-10-CM | POA: Diagnosis not present

## 2020-07-31 DIAGNOSIS — M9901 Segmental and somatic dysfunction of cervical region: Secondary | ICD-10-CM | POA: Diagnosis not present

## 2020-07-31 DIAGNOSIS — M9905 Segmental and somatic dysfunction of pelvic region: Secondary | ICD-10-CM | POA: Diagnosis not present

## 2020-08-07 DIAGNOSIS — M9902 Segmental and somatic dysfunction of thoracic region: Secondary | ICD-10-CM | POA: Diagnosis not present

## 2020-08-07 DIAGNOSIS — M542 Cervicalgia: Secondary | ICD-10-CM | POA: Diagnosis not present

## 2020-08-07 DIAGNOSIS — M9905 Segmental and somatic dysfunction of pelvic region: Secondary | ICD-10-CM | POA: Diagnosis not present

## 2020-08-07 DIAGNOSIS — M9901 Segmental and somatic dysfunction of cervical region: Secondary | ICD-10-CM | POA: Diagnosis not present

## 2020-08-14 DIAGNOSIS — M9901 Segmental and somatic dysfunction of cervical region: Secondary | ICD-10-CM | POA: Diagnosis not present

## 2020-08-14 DIAGNOSIS — M9902 Segmental and somatic dysfunction of thoracic region: Secondary | ICD-10-CM | POA: Diagnosis not present

## 2020-08-14 DIAGNOSIS — M9905 Segmental and somatic dysfunction of pelvic region: Secondary | ICD-10-CM | POA: Diagnosis not present

## 2020-08-14 DIAGNOSIS — M542 Cervicalgia: Secondary | ICD-10-CM | POA: Diagnosis not present

## 2020-08-21 DIAGNOSIS — M9905 Segmental and somatic dysfunction of pelvic region: Secondary | ICD-10-CM | POA: Diagnosis not present

## 2020-08-21 DIAGNOSIS — M9901 Segmental and somatic dysfunction of cervical region: Secondary | ICD-10-CM | POA: Diagnosis not present

## 2020-08-21 DIAGNOSIS — M9902 Segmental and somatic dysfunction of thoracic region: Secondary | ICD-10-CM | POA: Diagnosis not present

## 2020-08-21 DIAGNOSIS — M542 Cervicalgia: Secondary | ICD-10-CM | POA: Diagnosis not present

## 2020-08-28 DIAGNOSIS — M9903 Segmental and somatic dysfunction of lumbar region: Secondary | ICD-10-CM | POA: Diagnosis not present

## 2020-08-28 DIAGNOSIS — M9901 Segmental and somatic dysfunction of cervical region: Secondary | ICD-10-CM | POA: Diagnosis not present

## 2020-08-28 DIAGNOSIS — M542 Cervicalgia: Secondary | ICD-10-CM | POA: Diagnosis not present

## 2020-08-28 DIAGNOSIS — M9902 Segmental and somatic dysfunction of thoracic region: Secondary | ICD-10-CM | POA: Diagnosis not present

## 2020-09-03 DIAGNOSIS — M542 Cervicalgia: Secondary | ICD-10-CM | POA: Diagnosis not present

## 2020-09-03 DIAGNOSIS — M9901 Segmental and somatic dysfunction of cervical region: Secondary | ICD-10-CM | POA: Diagnosis not present

## 2020-09-03 DIAGNOSIS — M9903 Segmental and somatic dysfunction of lumbar region: Secondary | ICD-10-CM | POA: Diagnosis not present

## 2020-09-03 DIAGNOSIS — M9902 Segmental and somatic dysfunction of thoracic region: Secondary | ICD-10-CM | POA: Diagnosis not present

## 2020-09-11 DIAGNOSIS — M542 Cervicalgia: Secondary | ICD-10-CM | POA: Diagnosis not present

## 2020-09-11 DIAGNOSIS — M9902 Segmental and somatic dysfunction of thoracic region: Secondary | ICD-10-CM | POA: Diagnosis not present

## 2020-09-11 DIAGNOSIS — M9903 Segmental and somatic dysfunction of lumbar region: Secondary | ICD-10-CM | POA: Diagnosis not present

## 2020-09-11 DIAGNOSIS — M9901 Segmental and somatic dysfunction of cervical region: Secondary | ICD-10-CM | POA: Diagnosis not present

## 2020-09-15 DIAGNOSIS — M9903 Segmental and somatic dysfunction of lumbar region: Secondary | ICD-10-CM | POA: Diagnosis not present

## 2020-09-15 DIAGNOSIS — M542 Cervicalgia: Secondary | ICD-10-CM | POA: Diagnosis not present

## 2020-09-15 DIAGNOSIS — M9902 Segmental and somatic dysfunction of thoracic region: Secondary | ICD-10-CM | POA: Diagnosis not present

## 2020-09-15 DIAGNOSIS — M9901 Segmental and somatic dysfunction of cervical region: Secondary | ICD-10-CM | POA: Diagnosis not present

## 2020-09-24 DIAGNOSIS — E039 Hypothyroidism, unspecified: Secondary | ICD-10-CM | POA: Diagnosis not present

## 2020-09-24 DIAGNOSIS — E669 Obesity, unspecified: Secondary | ICD-10-CM | POA: Diagnosis not present

## 2020-09-24 DIAGNOSIS — R7301 Impaired fasting glucose: Secondary | ICD-10-CM | POA: Diagnosis not present

## 2020-09-24 DIAGNOSIS — R21 Rash and other nonspecific skin eruption: Secondary | ICD-10-CM | POA: Diagnosis not present

## 2020-09-24 DIAGNOSIS — D72829 Elevated white blood cell count, unspecified: Secondary | ICD-10-CM | POA: Diagnosis not present

## 2020-10-07 DIAGNOSIS — L299 Pruritus, unspecified: Secondary | ICD-10-CM | POA: Diagnosis not present

## 2020-10-07 DIAGNOSIS — L501 Idiopathic urticaria: Secondary | ICD-10-CM | POA: Diagnosis not present

## 2020-11-17 DIAGNOSIS — N3001 Acute cystitis with hematuria: Secondary | ICD-10-CM | POA: Diagnosis not present

## 2020-11-17 DIAGNOSIS — N390 Urinary tract infection, site not specified: Secondary | ICD-10-CM | POA: Diagnosis not present

## 2020-11-17 DIAGNOSIS — R300 Dysuria: Secondary | ICD-10-CM | POA: Diagnosis not present

## 2020-11-18 DIAGNOSIS — E039 Hypothyroidism, unspecified: Secondary | ICD-10-CM | POA: Diagnosis not present

## 2020-11-19 DIAGNOSIS — M9902 Segmental and somatic dysfunction of thoracic region: Secondary | ICD-10-CM | POA: Diagnosis not present

## 2020-11-19 DIAGNOSIS — M9903 Segmental and somatic dysfunction of lumbar region: Secondary | ICD-10-CM | POA: Diagnosis not present

## 2020-11-19 DIAGNOSIS — M542 Cervicalgia: Secondary | ICD-10-CM | POA: Diagnosis not present

## 2020-11-19 DIAGNOSIS — M9901 Segmental and somatic dysfunction of cervical region: Secondary | ICD-10-CM | POA: Diagnosis not present

## 2020-11-21 DIAGNOSIS — M9902 Segmental and somatic dysfunction of thoracic region: Secondary | ICD-10-CM | POA: Diagnosis not present

## 2020-11-21 DIAGNOSIS — M9901 Segmental and somatic dysfunction of cervical region: Secondary | ICD-10-CM | POA: Diagnosis not present

## 2020-11-21 DIAGNOSIS — M542 Cervicalgia: Secondary | ICD-10-CM | POA: Diagnosis not present

## 2020-11-21 DIAGNOSIS — M9903 Segmental and somatic dysfunction of lumbar region: Secondary | ICD-10-CM | POA: Diagnosis not present

## 2020-12-04 DIAGNOSIS — M9902 Segmental and somatic dysfunction of thoracic region: Secondary | ICD-10-CM | POA: Diagnosis not present

## 2020-12-04 DIAGNOSIS — M9903 Segmental and somatic dysfunction of lumbar region: Secondary | ICD-10-CM | POA: Diagnosis not present

## 2020-12-04 DIAGNOSIS — M9901 Segmental and somatic dysfunction of cervical region: Secondary | ICD-10-CM | POA: Diagnosis not present

## 2020-12-04 DIAGNOSIS — M542 Cervicalgia: Secondary | ICD-10-CM | POA: Diagnosis not present

## 2020-12-07 DIAGNOSIS — L299 Pruritus, unspecified: Secondary | ICD-10-CM | POA: Diagnosis not present

## 2020-12-07 DIAGNOSIS — W57XXXA Bitten or stung by nonvenomous insect and other nonvenomous arthropods, initial encounter: Secondary | ICD-10-CM | POA: Diagnosis not present

## 2020-12-16 DIAGNOSIS — L299 Pruritus, unspecified: Secondary | ICD-10-CM | POA: Diagnosis not present

## 2020-12-16 DIAGNOSIS — L501 Idiopathic urticaria: Secondary | ICD-10-CM | POA: Diagnosis not present

## 2020-12-16 DIAGNOSIS — R531 Weakness: Secondary | ICD-10-CM | POA: Diagnosis not present

## 2020-12-24 DIAGNOSIS — M9901 Segmental and somatic dysfunction of cervical region: Secondary | ICD-10-CM | POA: Diagnosis not present

## 2020-12-24 DIAGNOSIS — M542 Cervicalgia: Secondary | ICD-10-CM | POA: Diagnosis not present

## 2020-12-24 DIAGNOSIS — M9903 Segmental and somatic dysfunction of lumbar region: Secondary | ICD-10-CM | POA: Diagnosis not present

## 2020-12-24 DIAGNOSIS — M9902 Segmental and somatic dysfunction of thoracic region: Secondary | ICD-10-CM | POA: Diagnosis not present

## 2021-01-01 DIAGNOSIS — M9902 Segmental and somatic dysfunction of thoracic region: Secondary | ICD-10-CM | POA: Diagnosis not present

## 2021-01-01 DIAGNOSIS — M9903 Segmental and somatic dysfunction of lumbar region: Secondary | ICD-10-CM | POA: Diagnosis not present

## 2021-01-01 DIAGNOSIS — M9901 Segmental and somatic dysfunction of cervical region: Secondary | ICD-10-CM | POA: Diagnosis not present

## 2021-01-01 DIAGNOSIS — M542 Cervicalgia: Secondary | ICD-10-CM | POA: Diagnosis not present

## 2021-01-12 DIAGNOSIS — M9902 Segmental and somatic dysfunction of thoracic region: Secondary | ICD-10-CM | POA: Diagnosis not present

## 2021-01-12 DIAGNOSIS — M9901 Segmental and somatic dysfunction of cervical region: Secondary | ICD-10-CM | POA: Diagnosis not present

## 2021-01-12 DIAGNOSIS — M9903 Segmental and somatic dysfunction of lumbar region: Secondary | ICD-10-CM | POA: Diagnosis not present

## 2021-01-12 DIAGNOSIS — M542 Cervicalgia: Secondary | ICD-10-CM | POA: Diagnosis not present

## 2021-01-13 DIAGNOSIS — L501 Idiopathic urticaria: Secondary | ICD-10-CM | POA: Diagnosis not present

## 2021-01-13 DIAGNOSIS — L299 Pruritus, unspecified: Secondary | ICD-10-CM | POA: Diagnosis not present

## 2021-01-14 DIAGNOSIS — M9903 Segmental and somatic dysfunction of lumbar region: Secondary | ICD-10-CM | POA: Diagnosis not present

## 2021-01-14 DIAGNOSIS — M9901 Segmental and somatic dysfunction of cervical region: Secondary | ICD-10-CM | POA: Diagnosis not present

## 2021-01-14 DIAGNOSIS — M9902 Segmental and somatic dysfunction of thoracic region: Secondary | ICD-10-CM | POA: Diagnosis not present

## 2021-01-14 DIAGNOSIS — M542 Cervicalgia: Secondary | ICD-10-CM | POA: Diagnosis not present

## 2021-01-26 ENCOUNTER — Other Ambulatory Visit: Payer: Self-pay

## 2021-01-26 ENCOUNTER — Encounter: Payer: Self-pay | Admitting: Allergy and Immunology

## 2021-01-26 ENCOUNTER — Ambulatory Visit: Payer: BC Managed Care – PPO | Admitting: Allergy and Immunology

## 2021-01-26 VITALS — BP 138/94 | HR 88 | Temp 96.3°F | Resp 20 | Ht 68.0 in | Wt 299.0 lb

## 2021-01-26 DIAGNOSIS — L500 Allergic urticaria: Secondary | ICD-10-CM | POA: Diagnosis not present

## 2021-01-26 DIAGNOSIS — J3089 Other allergic rhinitis: Secondary | ICD-10-CM

## 2021-01-26 DIAGNOSIS — T781XXD Other adverse food reactions, not elsewhere classified, subsequent encounter: Secondary | ICD-10-CM

## 2021-01-26 DIAGNOSIS — L989 Disorder of the skin and subcutaneous tissue, unspecified: Secondary | ICD-10-CM | POA: Diagnosis not present

## 2021-01-26 MED ORDER — AUVI-Q 0.3 MG/0.3ML IJ SOAJ: INTRAMUSCULAR | 3 refills | Status: AC

## 2021-01-26 NOTE — Progress Notes (Signed)
[place] - [place] - [place] - [place] - [place]   Dear Dr. Mayford Knife,  Thank you for referring Courtney Miranda to the [facility] [place] on 01/26/2021.   Below is a summation of this patient's evaluation and recommendations.  Thank you for your referral. I will keep you informed about this patient's response to treatment.   If you have any questions please do not hesitate to contact me.   Sincerely,  Jessica Priest, MD Allergy / Immunology [facility]   ______________________________________________________________________    NEW PATIENT NOTE  Referring Provider: Olegario Shearer, MD Primary Provider: Blane Ohara, MD Date of office visit: 01/26/2021    Subjective:   Chief Complaint:  Courtney Miranda (DOB: [birth year]) is a 40 y.o. female who presents to the clinic on 01/26/2021 with a chief complaint of Urticaria .     HPI: Courtney Miranda presents to this clinic in evaluation of problems with hives.  Since August 2021 she has had been having recurrent red raised occasionally itchy lesions that can last several days and occasionally leave behind pigment for several days without any scarring and without any associated systemic or constitutional symptoms.  This Miranda to be an intermittent issue but when it does occur they can last up to 3 to 5 days of recurrent outbreaks and then she may get a hiatus of a week or so only to redevelop this problem again.  There is not really an obvious provoking factor giving rise to this issue.  She cannot really associate the onset of this issue or exacerbation of this issue with any type of exposure in the environment or the use of any type of medication or food consumption.  She was taking Zyrtec 4 times per day which did not really help.  She is currently using Allegra twice a day.  She has tried some topical steroids and she is not really sure that these helped very much.   She has had thorough evaluation by [facility] including screening blood tests which have all been normal.  She does have a history of having problems with cat exposure.  She will get irritation of her eyes and irritation of her nose.  She does have an indoor cat.  She must use a daily antihistamine to address this issue which works 100% successful.    She does have an issue when having too much ketchup or tomato consumption.  If she eats more than just a few bites she will develop throat clearing and an irritated mouth and feeling as though her throat is swelling and her tongue is swelling.  She can eat pizza with no problem.  Past Medical History:  Diagnosis Date   Migraines    Thyroid disease    Urticaria    Vitamin D deficiency     Past Surgical History:  Procedure Laterality Date   CESAREAN SECTION  09/21/2000    Allergies as of 01/26/2021       Reactions   Eletriptan Other (See Comments)   Metoclopramide Other (See Comments)   Sumatriptan Other (See Comments)        Medication List    fexofenadine 180 MG tablet Commonly known as: ALLEGRA Take 180 mg by mouth in the morning and at bedtime.   Magnesium 400 MG Caps Take 1 capsule by mouth daily.   Synthroid 200 MCG tablet Generic drug: levothyroxine Take by mouth.   VITAMIN D3 PO Take  50 mcg by mouth daily.   Wegovy 1.7 MG/0.75ML Soaj Generic drug: Semaglutide-Weight Management Inject into the skin.        Review of systems negative except as noted in HPI / PMHx or noted below:  Review of Systems  Constitutional: Negative.   HENT: Negative.    Eyes: Negative.   Respiratory: Negative.    Cardiovascular: Negative.   Gastrointestinal: Negative.   Genitourinary: Negative.   Musculoskeletal: Negative.   Skin: Negative.   Neurological: Negative.   Endo/Heme/Allergies: Negative.   Psychiatric/Behavioral: Negative.     Family History  Problem Relation Age of Onset   Diabetes Mother     Fibromyalgia Mother    Hypertension Mother    Hypertension Father    Thyroid disease Brother    Hyperlipidemia Maternal Grandmother    Diabetes Paternal Grandfather    Stroke Paternal Grandfather    Hypertension Paternal Grandfather     Social History   Socioeconomic History   Marital status: Married    Spouse name: Not on file   Number of children: 1   Years of education: Not on file   Highest education level: Not on file  Occupational History   Not on file  Tobacco Use   Smoking status: Never    Passive exposure: Past   Smokeless tobacco: Never  Vaping Use   Vaping Use: Never used  Substance and Sexual Activity   Alcohol use: No    Alcohol/week: 0.0 standard drinks   Drug use: No   Sexual activity: Not on file  Other Topics Concern   Not on file  Social History Narrative   Not on file   Environmental and Social history  Lives in a house with a dry environment, cat located inside the household, dog look inside the household, no carpet in the bedroom, plastic on the bed, plastic on the pillow, no smoking ongoing with inside the household.  She works as a Scientist, physiological at [employer]  Objective:   Vitals:   01/26/21 0942  BP: (!) 138/94  Pulse: 88  Resp: 20  Temp: (!) 96.3 F (35.7 C)  SpO2: 98%   Height: 5\' 8"  (172.7 cm) Weight: 299 lb (135.6 kg)  Physical Exam Constitutional:      Appearance: She is not diaphoretic.  HENT:     Head: Normocephalic.     Right Ear: Tympanic membrane, ear canal and external ear normal.     Left Ear: Tympanic membrane, ear canal and external ear normal.     Nose: Nose normal. No mucosal edema or rhinorrhea.     Mouth/Throat:     Pharynx: Uvula midline. No oropharyngeal exudate.  Eyes:     Conjunctiva/sclera: Conjunctivae normal.  Neck:     Thyroid: No thyromegaly.     Trachea: Trachea normal. No tracheal tenderness or tracheal deviation.  Cardiovascular:     Rate and Rhythm: Normal rate and regular  rhythm.     Heart sounds: Normal heart sounds, S1 normal and S2 normal. No murmur heard. Pulmonary:     Effort: No respiratory distress.     Breath sounds: Normal breath sounds. No stridor. No wheezing or rales.  Lymphadenopathy:     Head:     Right side of head: No tonsillar adenopathy.     Left side of head: No tonsillar adenopathy.     Cervical: No cervical adenopathy.  Skin:    Findings: Rash (Blotchy erythematous lesions without any scarring or scale.) present. No erythema.  Nails: There is no clubbing.  Neurological:     Mental Status: She is alert.    Diagnostics: Allergy skin tests were performed.  She demonstrated hypersensitivity to house dust mite and not against any other aeroallergens or screening panel of foods.  Assessment and Plan:    1. Allergic urticaria   2. Inflammatory dermatosis   3. Perennial allergic rhinitis   4. Adverse food reaction, subsequent encounter     1.  Allergen avoidance measures - dust mite  2.  Continue Allegra twice a day  3.  Start omalizumab injections 300 mg every 4 weeks  4.  Auvi-Q, Benadryl, MD/ER evaluation for allergic reaction  5.  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  Given the longevity of this issue I think that she would be a candidate for omalizumab injections.  She is presently considering this option.  If she does not respond to omalizumab then we will assume that she does have a strong inflammatory component contributing to this issue and we will try her on dupilumab.  Jessica Priest, MD Allergy / Immunology [facility] [place]

## 2021-01-26 NOTE — Patient Instructions (Addendum)
  1.  Allergen avoidance measures - dust mite  2.  Continue Allegra twice a day  3.  Start omalizumab injections 300 mg every 4 weeks  4.  Auvi-Q, Benadryl, MD/ER evaluation for allergic reaction  5.  Return to clinic in 12 weeks or earlier if problem

## 2021-01-27 ENCOUNTER — Encounter: Payer: Self-pay | Admitting: Allergy and Immunology

## 2021-02-16 ENCOUNTER — Ambulatory Visit: Payer: Self-pay | Admitting: Allergy and Immunology

## 2021-02-18 DIAGNOSIS — M9903 Segmental and somatic dysfunction of lumbar region: Secondary | ICD-10-CM | POA: Diagnosis not present

## 2021-02-18 DIAGNOSIS — M9902 Segmental and somatic dysfunction of thoracic region: Secondary | ICD-10-CM | POA: Diagnosis not present

## 2021-02-18 DIAGNOSIS — M5451 Vertebrogenic low back pain: Secondary | ICD-10-CM | POA: Diagnosis not present

## 2021-02-18 DIAGNOSIS — M9905 Segmental and somatic dysfunction of pelvic region: Secondary | ICD-10-CM | POA: Diagnosis not present

## 2021-02-24 DIAGNOSIS — M9905 Segmental and somatic dysfunction of pelvic region: Secondary | ICD-10-CM | POA: Diagnosis not present

## 2021-02-24 DIAGNOSIS — M9903 Segmental and somatic dysfunction of lumbar region: Secondary | ICD-10-CM | POA: Diagnosis not present

## 2021-02-24 DIAGNOSIS — M5451 Vertebrogenic low back pain: Secondary | ICD-10-CM | POA: Diagnosis not present

## 2021-02-24 DIAGNOSIS — M9902 Segmental and somatic dysfunction of thoracic region: Secondary | ICD-10-CM | POA: Diagnosis not present

## 2021-02-26 DIAGNOSIS — E039 Hypothyroidism, unspecified: Secondary | ICD-10-CM | POA: Diagnosis not present

## 2021-02-26 DIAGNOSIS — Z1331 Encounter for screening for depression: Secondary | ICD-10-CM | POA: Diagnosis not present

## 2021-02-26 DIAGNOSIS — E8881 Metabolic syndrome: Secondary | ICD-10-CM | POA: Diagnosis not present

## 2021-02-26 DIAGNOSIS — Z6841 Body Mass Index (BMI) 40.0 and over, adult: Secondary | ICD-10-CM | POA: Diagnosis not present

## 2021-03-02 DIAGNOSIS — M9902 Segmental and somatic dysfunction of thoracic region: Secondary | ICD-10-CM | POA: Diagnosis not present

## 2021-03-02 DIAGNOSIS — M9905 Segmental and somatic dysfunction of pelvic region: Secondary | ICD-10-CM | POA: Diagnosis not present

## 2021-03-02 DIAGNOSIS — M9903 Segmental and somatic dysfunction of lumbar region: Secondary | ICD-10-CM | POA: Diagnosis not present

## 2021-03-02 DIAGNOSIS — M5451 Vertebrogenic low back pain: Secondary | ICD-10-CM | POA: Diagnosis not present

## 2021-03-30 DIAGNOSIS — Z01419 Encounter for gynecological examination (general) (routine) without abnormal findings: Secondary | ICD-10-CM | POA: Diagnosis not present

## 2021-03-30 DIAGNOSIS — Z6841 Body Mass Index (BMI) 40.0 and over, adult: Secondary | ICD-10-CM | POA: Diagnosis not present

## 2021-03-31 DIAGNOSIS — M9905 Segmental and somatic dysfunction of pelvic region: Secondary | ICD-10-CM | POA: Diagnosis not present

## 2021-03-31 DIAGNOSIS — M9902 Segmental and somatic dysfunction of thoracic region: Secondary | ICD-10-CM | POA: Diagnosis not present

## 2021-03-31 DIAGNOSIS — M5451 Vertebrogenic low back pain: Secondary | ICD-10-CM | POA: Diagnosis not present

## 2021-03-31 DIAGNOSIS — M9903 Segmental and somatic dysfunction of lumbar region: Secondary | ICD-10-CM | POA: Diagnosis not present

## 2021-05-12 DIAGNOSIS — J324 Chronic pansinusitis: Secondary | ICD-10-CM | POA: Diagnosis not present

## 2021-05-14 DIAGNOSIS — Z20828 Contact with and (suspected) exposure to other viral communicable diseases: Secondary | ICD-10-CM | POA: Diagnosis not present

## 2021-05-14 DIAGNOSIS — J329 Chronic sinusitis, unspecified: Secondary | ICD-10-CM | POA: Diagnosis not present

## 2021-05-14 DIAGNOSIS — J101 Influenza due to other identified influenza virus with other respiratory manifestations: Secondary | ICD-10-CM | POA: Diagnosis not present

## 2021-05-14 DIAGNOSIS — J40 Bronchitis, not specified as acute or chronic: Secondary | ICD-10-CM | POA: Diagnosis not present

## 2021-06-06 DIAGNOSIS — R0981 Nasal congestion: Secondary | ICD-10-CM | POA: Diagnosis not present

## 2021-06-06 DIAGNOSIS — J324 Chronic pansinusitis: Secondary | ICD-10-CM | POA: Diagnosis not present

## 2021-06-06 DIAGNOSIS — J029 Acute pharyngitis, unspecified: Secondary | ICD-10-CM | POA: Diagnosis not present

## 2021-06-06 DIAGNOSIS — H6691 Otitis media, unspecified, right ear: Secondary | ICD-10-CM | POA: Diagnosis not present

## 2021-06-15 ENCOUNTER — Other Ambulatory Visit (HOSPITAL_COMMUNITY): Payer: Self-pay | Admitting: Registered Nurse

## 2021-06-15 DIAGNOSIS — N200 Calculus of kidney: Secondary | ICD-10-CM

## 2021-06-15 DIAGNOSIS — N3091 Cystitis, unspecified with hematuria: Secondary | ICD-10-CM | POA: Diagnosis not present

## 2021-06-15 DIAGNOSIS — R110 Nausea: Secondary | ICD-10-CM | POA: Diagnosis not present

## 2021-06-15 DIAGNOSIS — R300 Dysuria: Secondary | ICD-10-CM | POA: Diagnosis not present

## 2021-06-16 ENCOUNTER — Other Ambulatory Visit: Payer: Self-pay

## 2021-06-16 ENCOUNTER — Ambulatory Visit (HOSPITAL_COMMUNITY)
Admission: RE | Admit: 2021-06-16 | Discharge: 2021-06-16 | Disposition: A | Discharge status: Home / Self Care | Payer: BC Managed Care – PPO | Source: Ambulatory Visit | Attending: Registered Nurse | Admitting: Registered Nurse

## 2021-06-16 ENCOUNTER — Encounter (HOSPITAL_COMMUNITY): Payer: Self-pay

## 2021-06-16 DIAGNOSIS — N200 Calculus of kidney: Secondary | ICD-10-CM | POA: Insufficient documentation

## 2021-06-16 DIAGNOSIS — R600 Localized edema: Secondary | ICD-10-CM | POA: Diagnosis not present

## 2021-06-16 DIAGNOSIS — K8020 Calculus of gallbladder without cholecystitis without obstruction: Secondary | ICD-10-CM | POA: Diagnosis not present

## 2021-06-18 DIAGNOSIS — K8012 Calculus of gallbladder with acute and chronic cholecystitis without obstruction: Secondary | ICD-10-CM | POA: Diagnosis not present

## 2021-06-18 DIAGNOSIS — E039 Hypothyroidism, unspecified: Secondary | ICD-10-CM | POA: Diagnosis not present

## 2021-06-18 DIAGNOSIS — K8010 Calculus of gallbladder with chronic cholecystitis without obstruction: Secondary | ICD-10-CM | POA: Diagnosis not present

## 2021-06-18 DIAGNOSIS — Z48815 Encounter for surgical aftercare following surgery on the digestive system: Secondary | ICD-10-CM | POA: Diagnosis not present

## 2021-06-18 DIAGNOSIS — K8000 Calculus of gallbladder with acute cholecystitis without obstruction: Secondary | ICD-10-CM | POA: Diagnosis not present

## 2021-07-06 DIAGNOSIS — M542 Cervicalgia: Secondary | ICD-10-CM | POA: Diagnosis not present

## 2021-07-06 DIAGNOSIS — M9902 Segmental and somatic dysfunction of thoracic region: Secondary | ICD-10-CM | POA: Diagnosis not present

## 2021-07-06 DIAGNOSIS — M9901 Segmental and somatic dysfunction of cervical region: Secondary | ICD-10-CM | POA: Diagnosis not present

## 2021-07-06 DIAGNOSIS — M9905 Segmental and somatic dysfunction of pelvic region: Secondary | ICD-10-CM | POA: Diagnosis not present

## 2021-09-21 DIAGNOSIS — E039 Hypothyroidism, unspecified: Secondary | ICD-10-CM | POA: Diagnosis not present

## 2021-09-21 DIAGNOSIS — R7301 Impaired fasting glucose: Secondary | ICD-10-CM | POA: Diagnosis not present

## 2021-09-28 DIAGNOSIS — E669 Obesity, unspecified: Secondary | ICD-10-CM | POA: Diagnosis not present

## 2021-09-28 DIAGNOSIS — D72829 Elevated white blood cell count, unspecified: Secondary | ICD-10-CM | POA: Diagnosis not present

## 2021-09-28 DIAGNOSIS — R7301 Impaired fasting glucose: Secondary | ICD-10-CM | POA: Diagnosis not present

## 2021-09-28 DIAGNOSIS — E039 Hypothyroidism, unspecified: Secondary | ICD-10-CM | POA: Diagnosis not present

## 2022-04-20 DIAGNOSIS — R300 Dysuria: Secondary | ICD-10-CM | POA: Diagnosis not present

## 2022-06-25 DIAGNOSIS — F418 Other specified anxiety disorders: Secondary | ICD-10-CM | POA: Diagnosis not present

## 2022-06-25 DIAGNOSIS — Z6841 Body Mass Index (BMI) 40.0 and over, adult: Secondary | ICD-10-CM | POA: Diagnosis not present

## 2022-07-21 DIAGNOSIS — R7301 Impaired fasting glucose: Secondary | ICD-10-CM | POA: Diagnosis not present

## 2022-07-21 DIAGNOSIS — E039 Hypothyroidism, unspecified: Secondary | ICD-10-CM | POA: Diagnosis not present

## 2022-07-26 DIAGNOSIS — D72829 Elevated white blood cell count, unspecified: Secondary | ICD-10-CM | POA: Diagnosis not present

## 2022-07-26 DIAGNOSIS — E669 Obesity, unspecified: Secondary | ICD-10-CM | POA: Diagnosis not present

## 2022-07-26 DIAGNOSIS — E039 Hypothyroidism, unspecified: Secondary | ICD-10-CM | POA: Diagnosis not present

## 2022-07-26 DIAGNOSIS — R7301 Impaired fasting glucose: Secondary | ICD-10-CM | POA: Diagnosis not present

## 2022-09-19 IMAGING — CT CT RENAL STONE PROTOCOL
2 of 3 series · 16 of 46 positions shown, 18 images · non-contrast
Comparison: Pelvic sonogram from 2028. No recent abdominal imaging
is available for comparison.

CLINICAL DATA: Low back pain radiating to both sides in a
40-year-old female

EXAM:
CT ABDOMEN AND PELVIS WITHOUT CONTRAST
TECHNIQUE: Multidetector CT imaging of the abdomen and pelvis was performed
following the standard protocol without IV contrast.

[Series 4: coronal · coronal · 0.86mm/px · 3 of 124 slices shown]
[im 42/124  soft-tissue]
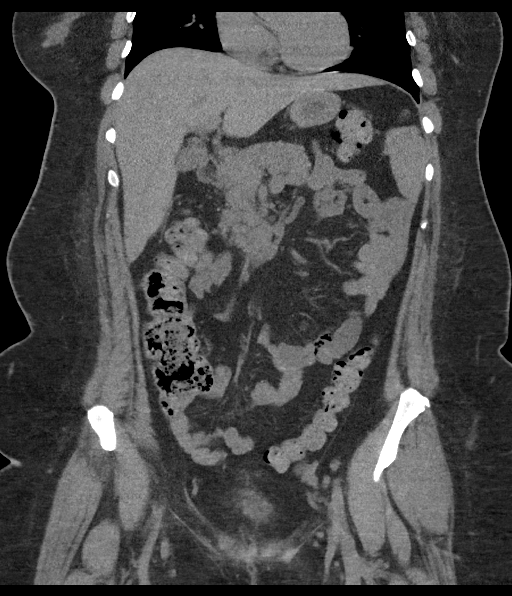
[im 55/124  soft-tissue]
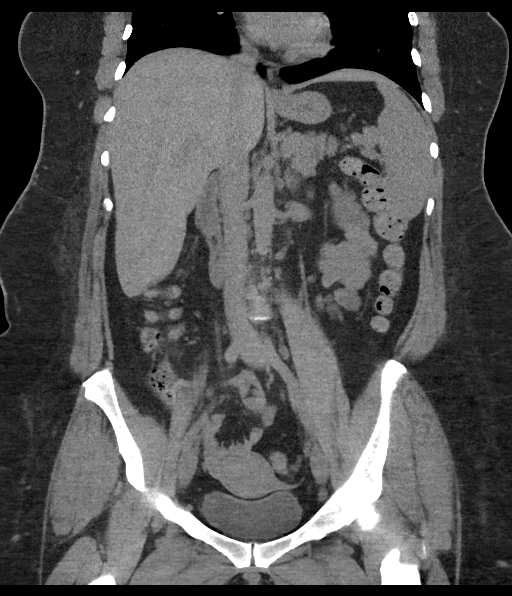
[im 69/124  soft-tissue]
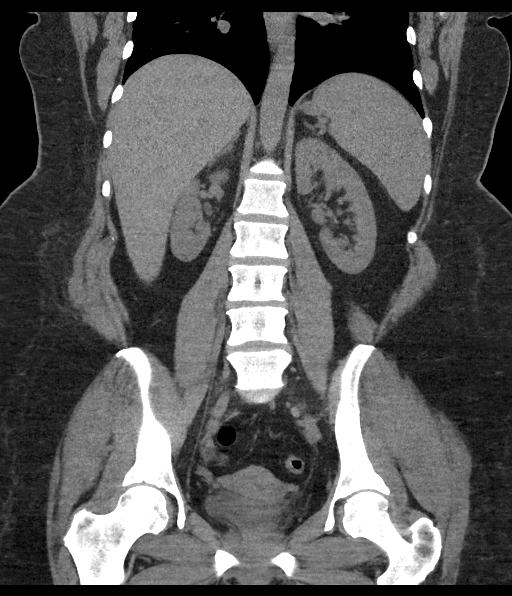

[Series 6: lung bases · axial · 0.74mm/px · z∈[+1488,+1594]mm · 13 of 61 slices shown, 15 images]
[im 4/61  soft-tissue]
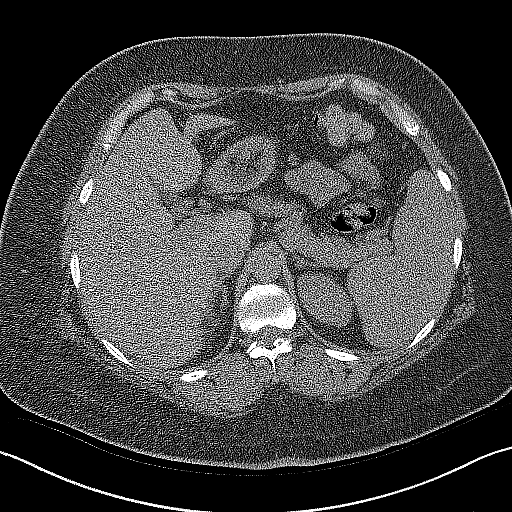
[im 4/61  bone]
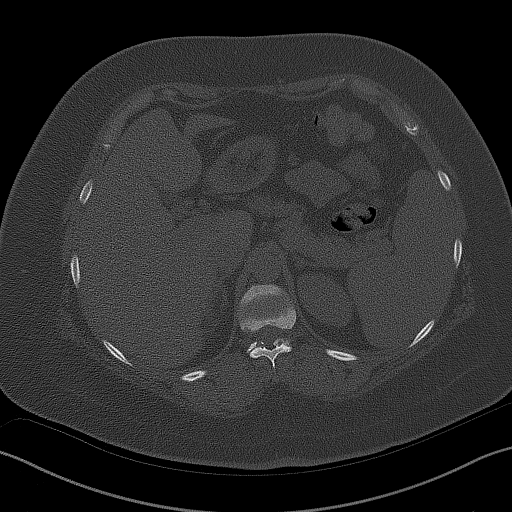
[im 8/61  soft-tissue]
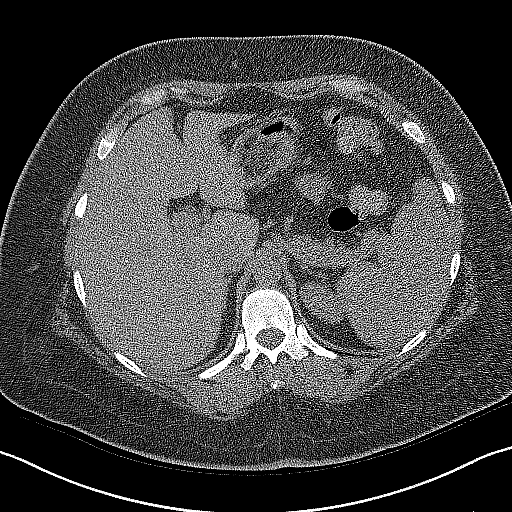
[im 12/61  soft-tissue]
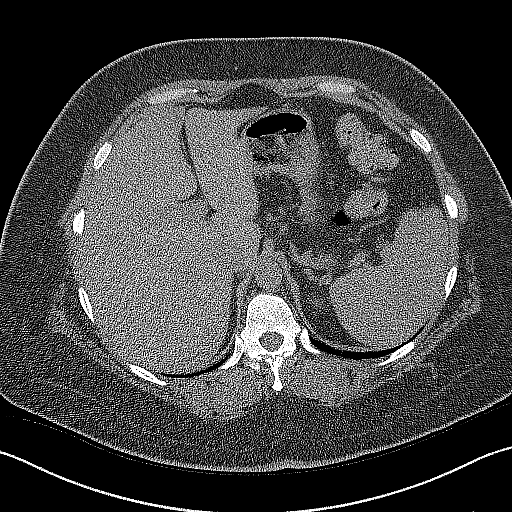
[im 18/61  soft-tissue]
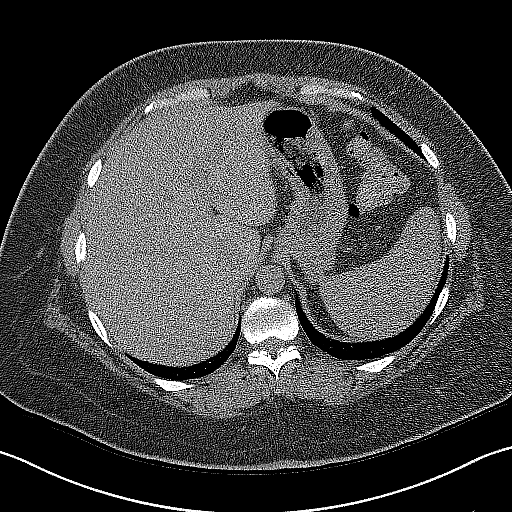
[im 22/61  soft-tissue]
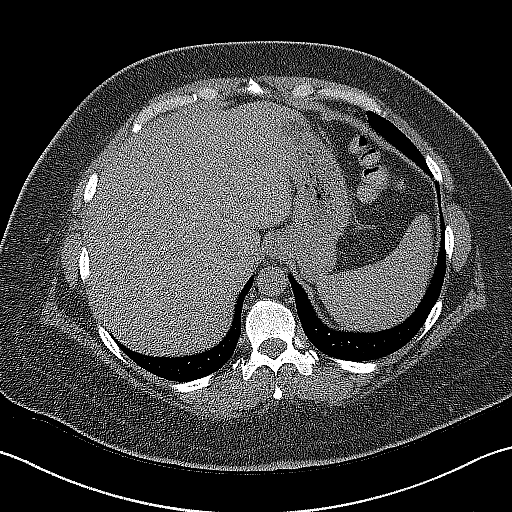
[im 26/61  soft-tissue]
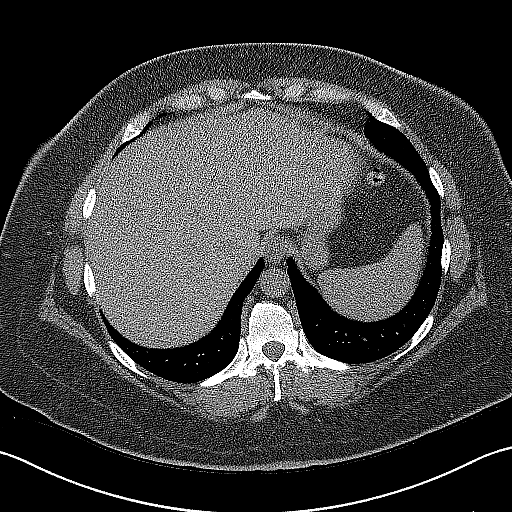
[im 31/61  soft-tissue]
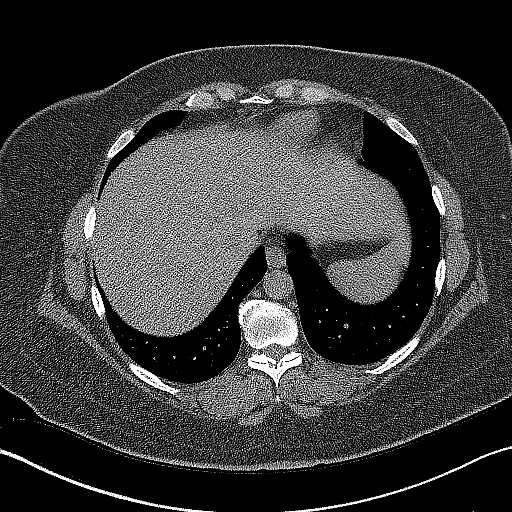
[im 35/61  soft-tissue]
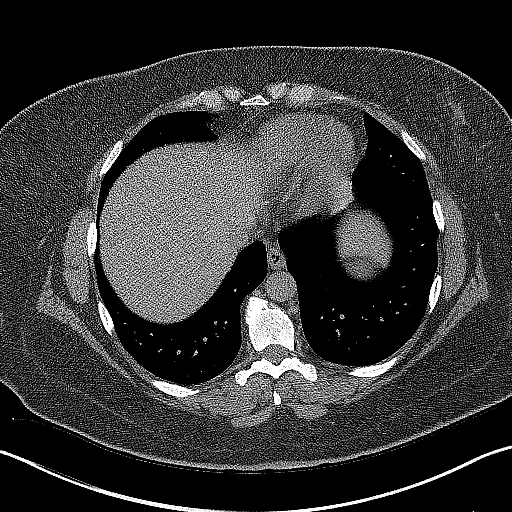
[im 39/61  soft-tissue]
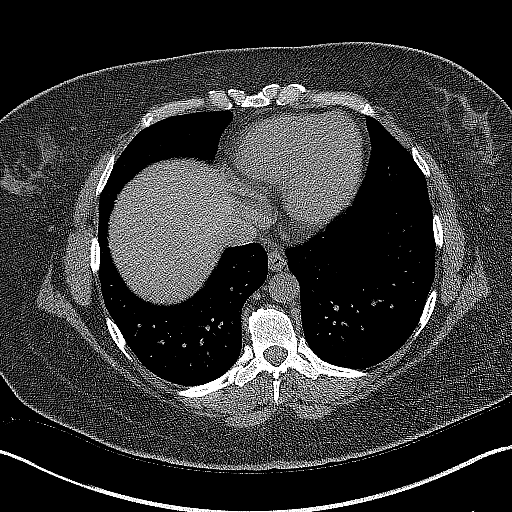
[im 39/61  bone]
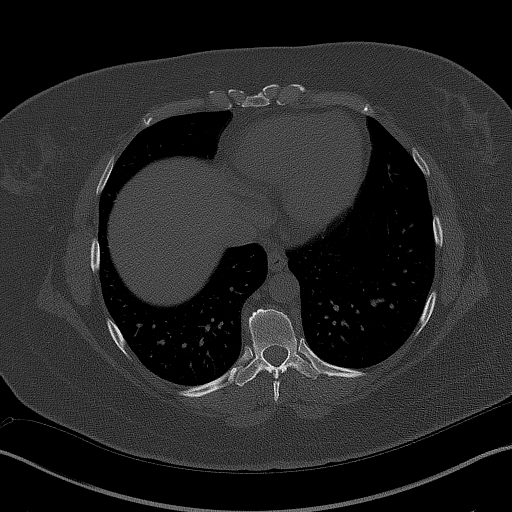
[im 43/61  soft-tissue]
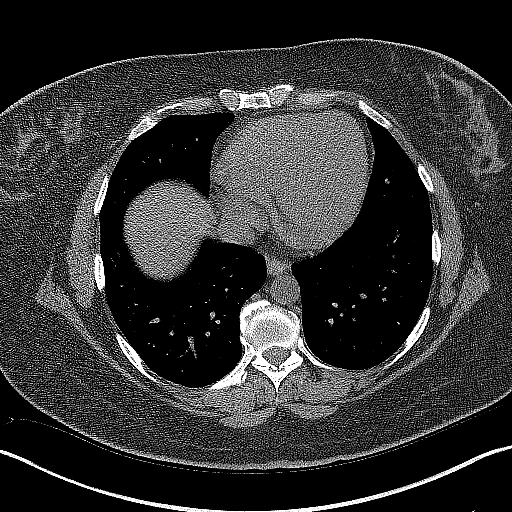
[im 49/61  soft-tissue]
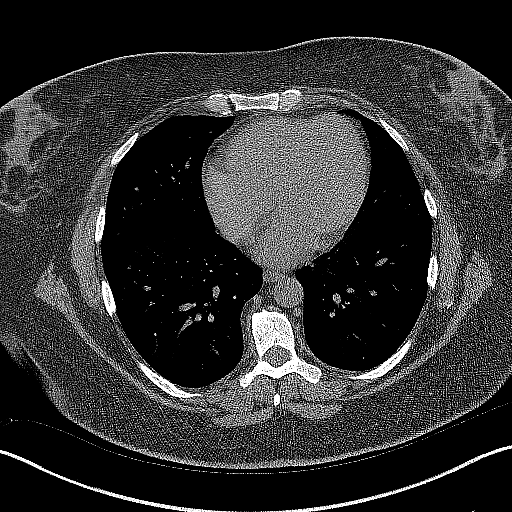
[im 53/61  soft-tissue]
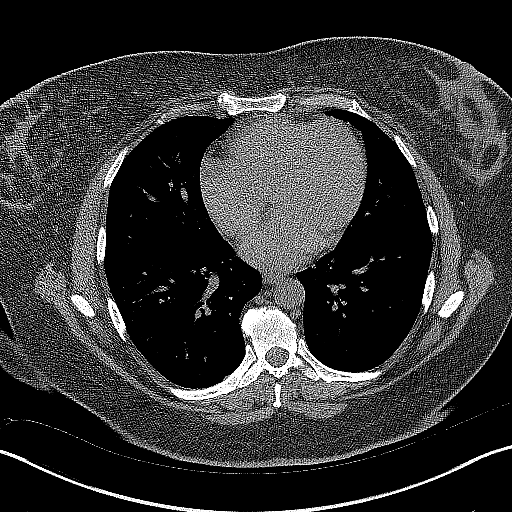
[im 57/61  soft-tissue]
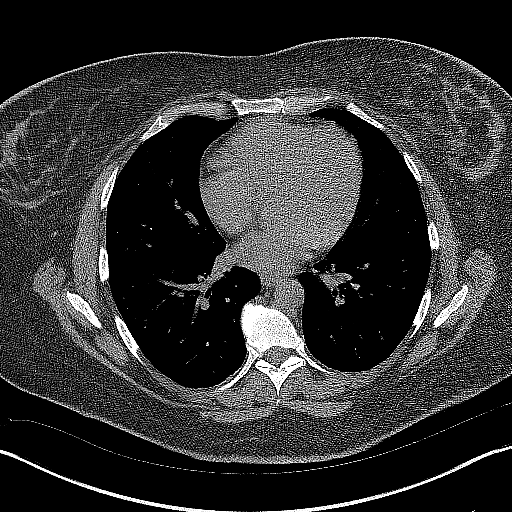

[16 of 46 positions shown; findings below may reference images not displayed]

FINDINGS: Lower chest: Unremarkable.

Hepatobiliary: Cholelithiasis. Radiolucent gallstones with central
lucency in the gallbladder. No pericholecystic stranding. No gross
biliary duct distension. Liver with smooth contours. No visible
lesion on noncontrast imaging.

Pancreas: Smooth contour, no signs of adjacent inflammation.

Spleen: Normal in size and contour.

Adrenals/Urinary Tract: Adrenal glands are normal.

Smooth contour the bilateral kidneys. No nephrolithiasis. No
stranding adjacent to the ureter kidneys. No hydronephrosis. Smooth
contour of the urinary bladder.

Stomach/Bowel: No stranding adjacent to the stomach. No sign of
small bowel obstruction. Normal appendix. Colon is largely stool
filled but nondistended. No stranding adjacent to the colon.

Vascular/Lymphatic:

Aorta with smooth contours. IVC with smooth contours. No aneurysmal
dilation of the abdominal aorta. There is no gastrohepatic or
hepatoduodenal ligament lymphadenopathy. No retroperitoneal or
mesenteric lymphadenopathy.

No pelvic sidewall lymphadenopathy.

Reproductive: Unremarkable on noncontrast imaging, no adnexal mass.

Other: Dependent edema along the posterior back within subcutaneous
fat. Mild body wall edema.

Musculoskeletal: No acute bone finding. No destructive bone process.
Spinal degenerative changes. Mild canal narrowing suggested due to
facet hypertrophy and short pedicles in the lower lumbar spine.
IMPRESSION: No nephrolithiasis or hydronephrosis.

Normal appendix.

Cholelithiasis.

Spinal degenerative changes. Mild canal narrowing suggested due to
facet hypertrophy and short pedicles in the lower lumbar spine.

## 2023-01-14 DIAGNOSIS — J019 Acute sinusitis, unspecified: Secondary | ICD-10-CM | POA: Diagnosis not present

## 2023-01-14 DIAGNOSIS — Z20822 Contact with and (suspected) exposure to covid-19: Secondary | ICD-10-CM | POA: Diagnosis not present

## 2023-01-14 DIAGNOSIS — J029 Acute pharyngitis, unspecified: Secondary | ICD-10-CM | POA: Diagnosis not present

## 2023-02-07 DIAGNOSIS — E039 Hypothyroidism, unspecified: Secondary | ICD-10-CM | POA: Diagnosis not present

## 2023-04-18 DIAGNOSIS — E039 Hypothyroidism, unspecified: Secondary | ICD-10-CM | POA: Diagnosis not present

## 2023-05-25 DIAGNOSIS — J0100 Acute maxillary sinusitis, unspecified: Secondary | ICD-10-CM | POA: Diagnosis not present

## 2023-05-25 DIAGNOSIS — Z20822 Contact with and (suspected) exposure to covid-19: Secondary | ICD-10-CM | POA: Diagnosis not present

## 2023-07-17 DIAGNOSIS — J069 Acute upper respiratory infection, unspecified: Secondary | ICD-10-CM | POA: Diagnosis not present

## 2023-07-27 DIAGNOSIS — E039 Hypothyroidism, unspecified: Secondary | ICD-10-CM | POA: Diagnosis not present

## 2023-08-03 ENCOUNTER — Other Ambulatory Visit: Payer: Self-pay | Admitting: Endocrinology

## 2023-08-03 ENCOUNTER — Ambulatory Visit
Admission: RE | Admit: 2023-08-03 | Discharge: 2023-08-03 | Disposition: A | Discharge status: Home / Self Care | Payer: BC Managed Care – PPO | Source: Ambulatory Visit | Attending: Endocrinology | Admitting: Endocrinology

## 2023-08-03 DIAGNOSIS — E039 Hypothyroidism, unspecified: Secondary | ICD-10-CM | POA: Diagnosis not present

## 2023-08-03 DIAGNOSIS — D72829 Elevated white blood cell count, unspecified: Secondary | ICD-10-CM | POA: Diagnosis not present

## 2023-08-03 DIAGNOSIS — E669 Obesity, unspecified: Secondary | ICD-10-CM | POA: Diagnosis not present

## 2023-08-03 DIAGNOSIS — E060 Acute thyroiditis: Secondary | ICD-10-CM | POA: Diagnosis not present

## 2023-09-15 DIAGNOSIS — E039 Hypothyroidism, unspecified: Secondary | ICD-10-CM | POA: Diagnosis not present

## 2023-10-05 DIAGNOSIS — Z1331 Encounter for screening for depression: Secondary | ICD-10-CM | POA: Diagnosis not present

## 2023-10-05 DIAGNOSIS — E039 Hypothyroidism, unspecified: Secondary | ICD-10-CM | POA: Diagnosis not present

## 2023-10-05 DIAGNOSIS — Z6841 Body Mass Index (BMI) 40.0 and over, adult: Secondary | ICD-10-CM | POA: Diagnosis not present

## 2023-10-28 DIAGNOSIS — R051 Acute cough: Secondary | ICD-10-CM | POA: Diagnosis not present

## 2023-10-28 DIAGNOSIS — R0981 Nasal congestion: Secondary | ICD-10-CM | POA: Diagnosis not present

## 2023-10-28 DIAGNOSIS — J029 Acute pharyngitis, unspecified: Secondary | ICD-10-CM | POA: Diagnosis not present

## 2023-10-28 DIAGNOSIS — J069 Acute upper respiratory infection, unspecified: Secondary | ICD-10-CM | POA: Diagnosis not present

## 2024-01-31 DIAGNOSIS — E039 Hypothyroidism, unspecified: Secondary | ICD-10-CM | POA: Diagnosis not present

## 2024-07-17 DIAGNOSIS — E039 Hypothyroidism, unspecified: Secondary | ICD-10-CM | POA: Diagnosis not present
